# Patient Record
Sex: Female | Born: 1980
Health system: Southern US, Community
[De-identification: ages and names within clinical notes are randomized; demographics above are authoritative.]

## PROBLEM LIST (undated history)

## (undated) DIAGNOSIS — I10 Essential (primary) hypertension: Secondary | ICD-10-CM

## (undated) DIAGNOSIS — Z803 Family history of malignant neoplasm of breast: Secondary | ICD-10-CM

## (undated) DIAGNOSIS — D649 Anemia, unspecified: Secondary | ICD-10-CM

## (undated) DIAGNOSIS — H6120 Impacted cerumen, unspecified ear: Secondary | ICD-10-CM

## (undated) DIAGNOSIS — N926 Irregular menstruation, unspecified: Secondary | ICD-10-CM

## (undated) DIAGNOSIS — E669 Obesity, unspecified: Secondary | ICD-10-CM

## (undated) HISTORY — DX: Obesity, unspecified: E66.9

## (undated) HISTORY — DX: Anemia, unspecified: D64.9

## (undated) HISTORY — DX: Irregular menstruation, unspecified: N92.6

## (undated) HISTORY — PX: OTHER SURGICAL HISTORY: SHX169

## (undated) HISTORY — PX: CHOLECYSTECTOMY: SHX55

## (undated) HISTORY — DX: Family history of malignant neoplasm of breast: Z80.3

## (undated) HISTORY — DX: Essential (primary) hypertension: I10

## (undated) HISTORY — DX: Impacted cerumen, unspecified ear: H61.20

---

## 2004-02-12 ENCOUNTER — Ambulatory Visit: Payer: Self-pay | Admitting: Family Medicine

## 2004-06-02 ENCOUNTER — Ambulatory Visit: Payer: Self-pay | Admitting: Family Medicine

## 2004-06-02 ENCOUNTER — Other Ambulatory Visit: Admission: RE | Admit: 2004-06-02 | Discharge: 2004-06-02 | Payer: Self-pay | Admitting: Family Medicine

## 2004-10-18 ENCOUNTER — Ambulatory Visit: Payer: Self-pay | Admitting: Family Medicine

## 2005-04-01 ENCOUNTER — Ambulatory Visit: Payer: Self-pay | Admitting: Family Medicine

## 2005-10-12 HISTORY — PX: APPENDECTOMY: SHX54

## 2005-10-27 ENCOUNTER — Ambulatory Visit: Payer: Self-pay | Admitting: Surgery

## 2005-11-17 ENCOUNTER — Ambulatory Visit: Payer: Self-pay | Admitting: Family Medicine

## 2005-12-22 ENCOUNTER — Ambulatory Visit: Payer: Self-pay | Admitting: Family Medicine

## 2006-01-26 ENCOUNTER — Ambulatory Visit: Payer: Self-pay | Admitting: Family Medicine

## 2006-05-05 ENCOUNTER — Ambulatory Visit: Payer: Self-pay | Admitting: Family Medicine

## 2006-05-05 LAB — CONVERTED CEMR LAB
Basophils Absolute: 0 10*3/uL (ref 0.0–0.1)
Basophils Relative: 0.4 % (ref 0.0–1.0)
Eosinophils Absolute: 0.1 10*3/uL (ref 0.0–0.6)
Eosinophils Relative: 2.1 % (ref 0.0–5.0)
HCT: 37.1 % (ref 36.0–46.0)
Hemoglobin: 12.6 g/dL (ref 12.0–15.0)
Iron: 44 ug/dL (ref 42–145)
Lymphocytes Relative: 25.1 % (ref 12.0–46.0)
MCHC: 34.1 g/dL (ref 30.0–36.0)
MCV: 81.1 fL (ref 78.0–100.0)
Monocytes Absolute: 0.5 10*3/uL (ref 0.2–0.7)
Monocytes Relative: 10.6 % (ref 3.0–11.0)
Neutro Abs: 2.9 10*3/uL (ref 1.4–7.7)
Neutrophils Relative %: 61.8 % (ref 43.0–77.0)
Platelets: 215 10*3/uL (ref 150–400)
RBC: 4.57 M/uL (ref 3.87–5.11)
RDW: 13.3 % (ref 11.5–14.6)
Saturation Ratios: 13.9 % — ABNORMAL LOW (ref 20.0–50.0)
Transferrin: 226.3 mg/dL (ref >=212.0)
WBC: 4.7 10*3/uL (ref 4.5–10.5)

## 2006-06-08 ENCOUNTER — Ambulatory Visit: Payer: Self-pay | Admitting: Family Medicine

## 2006-07-03 ENCOUNTER — Other Ambulatory Visit: Admission: RE | Admit: 2006-07-03 | Discharge: 2006-07-03 | Payer: Self-pay | Admitting: Family Medicine

## 2006-07-03 ENCOUNTER — Encounter: Payer: Self-pay | Admitting: Family Medicine

## 2006-07-03 ENCOUNTER — Ambulatory Visit: Payer: Self-pay | Admitting: Family Medicine

## 2006-07-03 LAB — CONVERTED CEMR LAB: Pap Smear: NORMAL

## 2006-07-11 ENCOUNTER — Encounter: Payer: Self-pay | Admitting: Family Medicine

## 2006-08-24 ENCOUNTER — Encounter: Payer: Self-pay | Admitting: Family Medicine

## 2006-08-24 DIAGNOSIS — N946 Dysmenorrhea, unspecified: Secondary | ICD-10-CM | POA: Insufficient documentation

## 2006-08-24 DIAGNOSIS — I10 Essential (primary) hypertension: Secondary | ICD-10-CM | POA: Insufficient documentation

## 2006-08-24 DIAGNOSIS — D509 Iron deficiency anemia, unspecified: Secondary | ICD-10-CM | POA: Insufficient documentation

## 2006-08-28 ENCOUNTER — Ambulatory Visit: Payer: Self-pay | Admitting: Family Medicine

## 2006-08-29 LAB — CONVERTED CEMR LAB
ALT: 28 units/L (ref 0–40)
AST: 22 units/L (ref 0–37)
Albumin: 3.7 g/dL (ref 3.5–5.2)
Alkaline Phosphatase: 62 units/L (ref 39–117)
BUN: 11 mg/dL (ref 6–23)
Bilirubin, Direct: 0.1 mg/dL (ref 0.0–0.3)
CO2: 28 meq/L (ref 19–32)
Calcium: 8.5 mg/dL (ref 8.4–10.5)
Chloride: 111 meq/L (ref 96–112)
Cholesterol: 159 mg/dL (ref 0–200)
Creatinine, Ser: 0.8 mg/dL (ref 0.4–1.2)
GFR calc Af Amer: 112 mL/min
GFR calc non Af Amer: 93 mL/min
Glucose, Bld: 92 mg/dL (ref 70–99)
HDL: 39.9 mg/dL (ref >39.0)
LDL Cholesterol: 98 mg/dL (ref 0–99)
Potassium: 4.1 meq/L (ref 3.5–5.1)
Sodium: 142 meq/L (ref 135–145)
TSH: 2.5 microintl units/mL (ref 0.35–5.50)
Total Bilirubin: 0.6 mg/dL (ref 0.3–1.2)
Total CHOL/HDL Ratio: 4
Total Protein: 6.6 g/dL (ref 6.0–8.3)
Triglycerides: 107 mg/dL (ref 0–149)
VLDL: 21 mg/dL (ref 0–40)

## 2006-09-26 ENCOUNTER — Telehealth (INDEPENDENT_AMBULATORY_CARE_PROVIDER_SITE_OTHER): Payer: Self-pay | Admitting: *Deleted

## 2006-10-01 ENCOUNTER — Encounter: Payer: Self-pay | Admitting: Family Medicine

## 2006-10-16 ENCOUNTER — Ambulatory Visit: Payer: Self-pay | Admitting: Family Medicine

## 2006-10-17 LAB — CONVERTED CEMR LAB
Albumin: 4.1 g/dL (ref 3.5–5.2)
BUN: 11 mg/dL (ref 6–23)
CO2: 25 meq/L (ref 19–32)
Calcium: 9.1 mg/dL (ref 8.4–10.5)
Chloride: 106 meq/L (ref 96–112)
Creatinine, Ser: 0.81 mg/dL (ref 0.40–1.20)
Glucose, Bld: 97 mg/dL (ref 70–99)
Phosphorus: 3.3 mg/dL (ref 2.3–4.6)
Potassium: 4 meq/L (ref 3.5–5.3)
Sodium: 141 meq/L (ref 135–145)

## 2007-02-21 ENCOUNTER — Ambulatory Visit: Payer: Self-pay | Admitting: Family Medicine

## 2007-04-19 ENCOUNTER — Ambulatory Visit: Payer: Self-pay | Admitting: Family Medicine

## 2007-04-19 DIAGNOSIS — R55 Syncope and collapse: Secondary | ICD-10-CM | POA: Insufficient documentation

## 2007-08-13 ENCOUNTER — Ambulatory Visit: Payer: Self-pay | Admitting: Family Medicine

## 2007-08-13 DIAGNOSIS — L259 Unspecified contact dermatitis, unspecified cause: Secondary | ICD-10-CM | POA: Insufficient documentation

## 2007-11-20 ENCOUNTER — Other Ambulatory Visit: Admission: RE | Admit: 2007-11-20 | Discharge: 2007-11-20 | Payer: Self-pay | Admitting: Family Medicine

## 2007-11-20 ENCOUNTER — Ambulatory Visit: Payer: Self-pay | Admitting: Family Medicine

## 2007-11-20 ENCOUNTER — Encounter: Payer: Self-pay | Admitting: Family Medicine

## 2007-11-20 DIAGNOSIS — N912 Amenorrhea, unspecified: Secondary | ICD-10-CM | POA: Insufficient documentation

## 2007-11-20 LAB — HM PAP SMEAR

## 2007-11-21 LAB — CONVERTED CEMR LAB
Albumin: 4.3 g/dL (ref 3.5–5.2)
BUN: 12 mg/dL (ref 6–23)
Basophils Absolute: 0 10*3/uL (ref 0.0–0.1)
Basophils Relative: 0 % (ref 0–1)
CO2: 24 meq/L (ref 19–32)
Calcium: 9 mg/dL (ref 8.4–10.5)
Chloride: 107 meq/L (ref 96–112)
Creatinine, Ser: 0.79 mg/dL (ref 0.40–1.20)
Eosinophils Absolute: 0.1 10*3/uL (ref 0.0–0.7)
Eosinophils Relative: 2 % (ref 0–5)
FSH: 5.7 milliintl units/mL
Glucose, Bld: 89 mg/dL (ref 70–99)
HCT: 39.9 % (ref 36.0–46.0)
Hemoglobin: 12.3 g/dL (ref 12.0–15.0)
LH: 9.7 milliintl units/mL
Lymphocytes Relative: 24 % (ref 12–46)
Lymphs Abs: 1.8 10*3/uL (ref 0.7–4.0)
MCHC: 30.8 g/dL (ref 30.0–36.0)
MCV: 84 fL (ref 78.0–100.0)
Monocytes Absolute: 0.4 10*3/uL (ref 0.1–1.0)
Monocytes Relative: 6 % (ref 3–12)
Neutro Abs: 5 10*3/uL (ref 1.7–7.7)
Neutrophils Relative %: 68 % (ref 43–77)
Phosphorus: 3.2 mg/dL (ref 2.3–4.6)
Platelets: 272 10*3/uL (ref 150–400)
Potassium: 4.6 meq/L (ref 3.5–5.3)
Preg, Serum: NEGATIVE
Prolactin: 7.9 ng/mL
RBC: 4.75 M/uL (ref 3.87–5.11)
RDW: 14.4 % (ref 11.5–15.5)
Sodium: 140 meq/L (ref 135–145)
TSH: 2.805 microintl units/mL (ref 0.350–4.50)
WBC: 7.4 10*3/uL (ref 4.0–10.5)

## 2007-12-24 ENCOUNTER — Telehealth: Payer: Self-pay | Admitting: Family Medicine

## 2008-01-21 ENCOUNTER — Encounter: Payer: Self-pay | Admitting: Family Medicine

## 2008-01-22 ENCOUNTER — Encounter: Payer: Self-pay | Admitting: Family Medicine

## 2008-04-23 ENCOUNTER — Ambulatory Visit: Payer: Self-pay | Admitting: Family Medicine

## 2008-04-23 DIAGNOSIS — K589 Irritable bowel syndrome without diarrhea: Secondary | ICD-10-CM | POA: Insufficient documentation

## 2008-06-26 ENCOUNTER — Ambulatory Visit: Payer: Self-pay | Admitting: Family Medicine

## 2008-06-26 DIAGNOSIS — K3189 Other diseases of stomach and duodenum: Secondary | ICD-10-CM | POA: Insufficient documentation

## 2008-06-26 DIAGNOSIS — R1013 Epigastric pain: Secondary | ICD-10-CM

## 2008-06-26 LAB — CONVERTED CEMR LAB
Bilirubin Urine: NEGATIVE
Blood in Urine, dipstick: NEGATIVE
Casts: 0 /lpf
Glucose, Urine, Semiquant: NEGATIVE
Ketones, urine, test strip: NEGATIVE
Nitrite: NEGATIVE
Specific Gravity, Urine: >=1.03
Urine crystals, microscopic: 0 /hpf
Urobilinogen, UA: 0.2
Yeast, UA: 0
pH: 6

## 2008-06-27 ENCOUNTER — Encounter: Payer: Self-pay | Admitting: Family Medicine

## 2009-05-14 ENCOUNTER — Ambulatory Visit: Payer: Self-pay | Admitting: Family Medicine

## 2009-05-14 DIAGNOSIS — H6120 Impacted cerumen, unspecified ear: Secondary | ICD-10-CM | POA: Insufficient documentation

## 2010-03-25 ENCOUNTER — Ambulatory Visit
Admission: RE | Admit: 2010-03-25 | Discharge: 2010-03-25 | Payer: Self-pay | Source: Home / Self Care | Attending: Family Medicine | Admitting: Family Medicine

## 2010-03-29 ENCOUNTER — Telehealth: Payer: Self-pay | Admitting: Family Medicine

## 2010-04-13 NOTE — Letter (Signed)
Summary: [facility]   [facility]   Imported By: Carin Primrose 07/01/2008 14:23:08  _____________________________________________________________________  External Attachment:    Type:   Image     Comment:   External Document  Appended Document: [facility]  please let pt know I just rev her pelvic US from november- there was a possible cyst on the L- and I wonder if this is causing her pain  I would like her to f/u with her gyn for current L sided pelvic pain- please let me know if she needs a referral   Appended Document: [facility]  Advised patient.

## 2010-04-13 NOTE — Assessment & Plan Note (Signed)
Summary: PAIN IN LEFT SIDE/ABOUT 2 TO 3 WEEKS/BIR   Vital Signs:  Patient profile:   30 year old female Height:      66 inches Weight:      277 pounds BMI:     44.87 Temp:     98 degrees F oral Pulse rate:   120 / minute BP sitting:   132 / 84  (left arm) Cuff size:   large  Vitals Entered By: Liane Comber (June 26, 2008 11:51 AM)  History of Present Illness: her side hurts - in pelvic area /lower L side -- a little bit every day  more severe once per week  no back or flank pain at all   had uti in winter - thought that was causing it then  it got some better  now is on and off - only seldomly severe  if she leans to R- it hurts more  also hurts more when her bladder is full   bms are better since starting citrucel daily (used to be constipated )   some indigestion occ nausea but no vomiting  no specific foods flare it   just had menses - fairly regular  menses are heavy and usually last about 7 days  also thinks the pain gets worse around her period  is not sexually active at all - and not pregnant   no burning or odor to urine   did have pelvic US with her gyn in january- for different pain-- and excessive bleeding -- and it was normal       Allergies: No Known Drug Allergies  Past History:  Past Surgical History:    Right foot fracture    Appendectomy (10/2005)     (08/24/2006)  Family History:    Father:     Mother:     Siblings: 1 brother, 1 sister    GM HTN    MGF MI, and HTN    GM rectal cancer      (11/20/2007)  Social History:    Marital Status:     Children:     Occupation:     Never Smoked    occasional alcohol      (04/19/2007)  Past Medical History:    Anemia-iron deficiency    Hypertension- borderline    irregular menses             [facility]   Review of Systems General:  Denies fatigue, fever, loss of appetite, and malaise. Eyes:  Denies blurring. CV:  Denies chest pain or discomfort, lightheadness, and  palpitations. Resp:  Denies cough and wheezing. GI:  Complains of indigestion, nausea, and yellowish skin color; denies abdominal pain, diarrhea, gas, loss of appetite, and vomiting; taking pepcid for indigestion- does help. GU:  Denies discharge, dysuria, hematuria, urinary frequency, and urinary hesitancy. MS:  Complains of low back pain and mid back pain. Derm:  Denies itching, poor wound healing, and rash. Neuro:  Denies numbness, tingling, and weakness. Endo:  Denies excessive thirst and excessive urination.  Physical Exam  General:  obese but well appearing  Head:  normocephalic, atraumatic, and no abnormalities observed.   Eyes:  vision grossly intact, pupils equal, pupils round, and pupils reactive to light.   Mouth:  pharynx pink and moist.   Neck:  No deformities, masses, or tenderness noted. Chest Wall:  No deformities, masses, or tenderness noted. Lungs:  Normal respiratory effort, chest expands symmetrically. Lungs are clear to auscultation, no crackles or wheezes.  Heart:  Normal rate and regular rhythm. S1 and S2 normal without gallop, murmur, click, rub or other extra sounds. Abdomen:  very slt epigastric tenderness suprapubic tenderness- without rebound or gaurding soft, normal bowel sounds, no masses, no guarding, no rigidity, no hepatomegaly, and no splenomegaly.   Msk:  No deformity or scoliosis noted of thoracic or lumbar spine.   Extremities:  No clubbing, cyanosis, edema, or deformity noted with normal full range of motion of all joints.   Neurologic:  sensation intact to light touch and gait normal.   Skin:  Intact without suspicious lesions or rashes no pallor or jaundice  Cervical Nodes:  No lymphadenopathy noted Inguinal Nodes:  No significant adenopathy Psych:  normal affect, talkative and pleasant    Impression & Recommendations:  Problem # 1:  UTI (ICD-599.0) Assessment New with intermittent L lower abd pain and nausea pend urine culture  tx with  cipro - and update  if pain does not resolve- discussed option of pelvic US to look for cyst (per pt had [place] at [facility] this winter - will have to call for that) adv to continue citrucel for IBS  Her updated medication list for this problem includes:    Cipro 250 Mg Tabs (Ciprofloxacin hcl) .Marland Kitchen... 1 by mouth twice daily  for 5 days  Orders: T-Culture, Urine ([id number]) UA Dipstick W/ Micro (manual) (29562)  Problem # 2:  DYSPEPSIA (ICD-536.8) Assessment: New more frequent lately with some wt gain  will try pepcid q am and update if not imp disc diet /anti reflux  Complete Medication List: 1)  Ferrous Sulfate 325 (65 Fe) Mg Tbec (Ferrous sulfate) .... Take one by mouth daily 2)  Hydrochlorothiazide 25 Mg Tabs (Hydrochlorothiazide) .... Take one by mouth daily 3)  Elocon 0.1 % Crea (Mometasone furoate) .... Apply to affected area (small amount ) once daily as directed 4)  Pepcid Ac Maximum Strength 20 Mg Tabs (Famotidine) .Marland Kitchen.. 1 by mouth once daily 5)  Cipro 250 Mg Tabs (Ciprofloxacin hcl) .Marland Kitchen.. 1 by mouth twice daily  for 5 days  Patient Instructions: 1)  start taking one pepcid every am  2)  continue drinking lots of water 3)  call or seek care is symptoms don't improve in 2-3 days or if you develop back pain, nausea, or vomiting  4)  take the cipro as directed  5)  I will update you with a plan when urine culture returns  Prescriptions: CIPRO 250 MG TABS (CIPROFLOXACIN HCL) 1 by mouth twice daily  for 5 days  #10 x 0   Entered and Authorized by:   Judith Part MD   Signed by:   Judith Part MD on 06/26/2008   Method used:   Print then Give to Patient   RxID:   [id number]       Prior Medications (reviewed today): FERROUS SULFATE 325 (65 FE) MG  TBEC (FERROUS SULFATE) take one by mouth daily HYDROCHLOROTHIAZIDE 25 MG TABS (HYDROCHLOROTHIAZIDE) take one by mouth daily ELOCON 0.1 %  CREA (MOMETASONE FUROATE) apply to affected area (small amount ) once daily  as directed PEPCID AC MAXIMUM STRENGTH 20 MG TABS (FAMOTIDINE) 1 by mouth once daily CIPRO 250 MG TABS (CIPROFLOXACIN HCL) 1 by mouth twice daily  for 5 days Current Allergies (reviewed today): No known allergies  Current Medications (including changes made in today's visit):  FERROUS SULFATE 325 (65 FE) MG  TBEC (FERROUS SULFATE) take one by mouth daily HYDROCHLOROTHIAZIDE 25 MG TABS (  HYDROCHLOROTHIAZIDE) take one by mouth daily ELOCON 0.1 %  CREA (MOMETASONE FUROATE) apply to affected area (small amount ) once daily as directed PEPCID AC MAXIMUM STRENGTH 20 MG TABS (FAMOTIDINE) 1 by mouth once daily CIPRO 250 MG TABS (CIPROFLOXACIN HCL) 1 by mouth twice daily  for 5 days  Laboratory Results   Urine Tests  Date/Time Received: June 26, 2008 12:31 PM Date/Time Reported: June 26, 2008 12:31 PM   Routine Urinalysis   Color: yellow Appearance: Clear Glucose: negative   (Normal Range: Negative) Bilirubin: negative   (Normal Range: Negative) Ketone: negative   (Normal Range: Negative) Spec. Gravity: >=1.030   (Normal Range: 1.003-1.035) Blood: negative   (Normal Range: Negative) pH: 6.0   (Normal Range: 5.0-8.0) Protein: trace   (Normal Range: Negative) Urobilinogen: 0.2   (Normal Range: 0-1) Nitrite: negative   (Normal Range: Negative) Leukocyte Esterace: trace   (Normal Range: Negative)  Urine Microscopic WBC/HPF: many RBC/HPF: 2-3 Bacteria/HPF: many Mucous/HPF: mod Epithelial/HPF: 1-4 Crystals/HPF: 0 Casts/LPF: 0 Yeast/HPF: 0

## 2010-04-13 NOTE — Assessment & Plan Note (Signed)
Summary: BACK PAIN/CLE   Vital Signs:  Patient Profile:   30 Years Old Female Weight:      277 pounds Temp:     97.7 degrees F oral Pulse rate:   68 / minute Pulse rhythm:   regular  Vitals Entered By: Lowella Petties (February 21, 2007 11:44 AM)                 Chief Complaint:  Pain across lower back.  History of Present Illness: back has hurt for about a month on and off severe at times- pain in lower back at times limited rom- cannot bend over pain occas radiates to one or both legs no numbness, no weakness , no bowel or bladder incontinence  now is not too bad- just dull pain across lower back has not taken anything for it- rarely ibuprofen which does not help much has not tried heat or ice best when lying down in bed no specific injury or hx of back pain in the past in that area  Current Allergies: No known allergies      Review of Systems  General      Denies chills and fever.  CV      Denies chest pain or discomfort.  Resp      Denies cough.  GU      Denies discharge, dysuria, urinary frequency, and urinary hesitancy.  Derm      Denies lesion(s) and rash.  Neuro      Denies numbness and weakness.   Physical Exam  General:     overweight but generally well appearing  Head:     normocephalic, atraumatic, and no abnormalities observed.   Eyes:     vision grossly intact, pupils equal, pupils round, and pupils reactive to light.   Neck:     supple and full ROM.  no bony tenderness Lungs:     Normal respiratory effort, chest expands symmetrically. Lungs are clear to auscultation, no crackles or wheezes. Heart:     Normal rate and regular rhythm. S1 and S2 normal without gallop, murmur, click, rub or other extra sounds. Msk:     no LS process tenderness mild bilat peri lumbar tenderness/tightness no skin changes flex 45 deg ext 20 deg with discomfort mild pain on lat flex twist is nl neg SLR bilat Extremities:     No clubbing,  cyanosis, edema, or deformity noted with normal full range of motion of all joints.   Neurologic:     strength normal in all extremities, sensation intact to light touch, gait normal, and DTRs symmetrical and normal.   Skin:     Intact without suspicious lesions or rashes Psych:     nl affect    Impression & Recommendations:  Problem # 1:  BACK PAIN (ICD-724.5) low back pain/strain with no neurologic signs or symptoms disc mechanics of low back pain- and tx plan given flexeril to use as needed with caution and aleve will refer to PT if worse or no imp will get LS films Her updated medication list for this problem includes:    Flexeril 10 Mg Tabs (Cyclobenzaprine hcl) .Marland Kitchen... 1/2 to 1 by mouth three times a day as needed back pain  Orders: Physical Therapy Referral (PT)   Complete Medication List: 1)  Ferrous Sulfate 325 (65 Fe) Mg Tbec (Ferrous sulfate) .... Take one by mouth daily 2)  Hctz 25 Mg  .Marland KitchenMarland Kitchen. 1 by mouth q am 3)  Flexeril 10 Mg Tabs (Cyclobenzaprine hcl) .Marland KitchenMarland KitchenMarland Kitchen  1/2 to 1 by mouth three times a day as needed back pain   Patient Instructions: 1)  you can try aleve 1-2 pills twice daily with food as needed for back pain 2)  also use flexeril as needed (muscle relaxer)- caution because it can sedate 3)  use heat on back when needed 4)  we will refer you to PT at check out 5)  if you suddenly get worse, or have any new numbness, weakness or incontinence- please let me know 6)  if not improving in 2-4 weeks- also let me know    Prescriptions: FLEXERIL 10 MG  TABS (CYCLOBENZAPRINE HCL) 1/2 to 1 by mouth three times a day as needed back pain  #30 x 0   Entered and Authorized by:   Judith Part MD   Signed by:   Judith Part MD on 02/21/2007   Method used:   Print then Give to Patient   RxID:   [id number]  ]

## 2010-04-13 NOTE — Consult Note (Signed)
Summary: [facility]/Consultation Report/Dr. [facility]/Consultation Report/Dr. Patton Salles   Imported By: Mickle Asper 01/25/2008 16:10:28  _____________________________________________________________________  External Attachment:    Type:   Image     Comment:   External Document

## 2010-04-13 NOTE — Assessment & Plan Note (Signed)
Summary: cpx w/pap come fasting will do labs that day/rbh   Vital Signs:  Patient Profile:   30 Years Old Female Weight:      284 pounds Temp:     97.9 degrees F oral Pulse rate:   76 / minute Pulse rhythm:   regular BP sitting:   110 / 78  (left arm) Cuff size:   large  Vitals Entered By: Liane Comber (November 20, 2007 10:00 AM)                 Chief Complaint:  cpx/ pap.  History of Present Illness: feeling good  needs pap and gyn exam today  had period the end of june skipped july  spotted all through august no cramping  not on birth control currently -- and not trying to get pregnant -- and no chance   periods have never done this before  no stress or wt changes--  back in june -- gained 10 lbs and then lost 5 no new meds of supplement   eating lots more veg-- cut back on meat and bread and cut back on portions does exercise videos  goal is to start 20 min every other day-- has not started yet  has counted calories with other diet--- less than 2000  takes pepcid AC daily - for indigestion       Current Allergies (reviewed today): No known allergies   Past Medical History:    Reviewed history from 08/24/2006 and no changes required:       Anemia-iron deficiency       Hypertension- borderline  Past Surgical History:    Reviewed history from 08/24/2006 and no changes required:       Right foot fracture       Appendectomy (10/2005)   Family History:    Reviewed history from 04/19/2007 and no changes required:       Father:        Mother:        Siblings: 1 brother, 1 sister       GM HTN       MGF MI, and HTN       GM rectal cancer   Social History:    Reviewed history from 04/19/2007 and no changes required:       Marital Status:        Children:        Occupation:        Never Smoked       occasional alcohol     Review of Systems  General      Denies chills, fatigue, fever, and malaise.  Eyes      Denies blurring and eye  pain.  ENT      Denies sore throat.  CV      Denies chest pain or discomfort, palpitations, and shortness of breath with exertion.  Resp      Denies cough and wheezing.  GI      Complains of indigestion.      Denies abdominal pain, bloody stools, and change in bowel habits.      takes pepcid which helps indigestion  GU      Denies discharge and dysuria.  MS      Denies joint pain, joint redness, and joint swelling.  Derm      Denies itching, lesion(s), and rash.  Neuro      Denies numbness and tingling.  Psych      mood is fairly good  Endo      Denies cold intolerance, excessive thirst, excessive urination, and heat intolerance.  Heme      Denies abnormal bruising.   Physical Exam  General:     overweight but generally well appearing  Head:     normocephalic, atraumatic, and no abnormalities observed.   Eyes:     vision grossly intact, pupils equal, pupils round, and pupils reactive to light.   Ears:     R ear normal and L ear normal.   Nose:     no nasal discharge.   Mouth:     pharynx pink and moist.   Neck:     supple with full rom and no masses or thyromegally, no JVD or carotid bruit  Chest Wall:     No deformities, masses, or tenderness noted. Breasts:     No mass, nodules, thickening, tenderness, bulging, retraction, inflamation, nipple discharge or skin changes noted.   Lungs:     Normal respiratory effort, chest expands symmetrically. Lungs are clear to auscultation, no crackles or wheezes. Heart:     Normal rate and regular rhythm. S1 and S2 normal without gallop, murmur, click, rub or other extra sounds. Abdomen:     Bowel sounds positive,abdomen soft and non-tender without masses, organomegaly or hernias noted.  no renal bruits  Rectal:     No external abnormalities noted. Normal sphincter tone. No rectal masses or tenderness.- heme neg stool  Genitalia:     Normal introitus for age, no external lesions, no vaginal discharge, mucosa  pink and moist, no vaginal or cervical lesions, no vaginal atrophy, no friaility or hemorrhage, normal uterus size and position, no adnexal masses or tenderness Msk:     No deformity or scoliosis noted of thoracic or lumbar spine.   Pulses:     R and L carotid,radial,femoral,dorsalis pedis and posterior tibial pulses are full and equal bilaterally Extremities:     No clubbing, cyanosis, edema, or deformity noted with normal full range of motion of all joints.   Neurologic:     sensation intact to light touch, gait normal, and DTRs symmetrical and normal.   Skin:     Intact without suspicious lesions or rashes some lentigos  Cervical Nodes:     No lymphadenopathy noted Axillary Nodes:     No palpable lymphadenopathy Inguinal Nodes:     No significant adenopathy Psych:     normal affect, talkative and pleasant     Impression & Recommendations:  Problem # 1:  HEALTH MAINTENANCE EXAM (ICD-V70.0) Assessment: Comment Only reviewed health habits including diet, exercise and skin cancer prevention reviewed health maintenance list and family history urged imp of wt loss for general health  Td update today Orders: Venipuncture (54098)   Problem # 2:  ROUTINE GYNECOLOGICAL EXAMINATION (ICD-V72.31) Assessment: Comment Only pap done amenorrhea-- see note  Problem # 3:  AMENORRHEA (ICD-626.0) Assessment: New will do labs incl fsh/lh and pl, serum preg if all neg - and still no menses-- consider prog w/d weight may be a factor-- discussed this  ? consider poss of pcos in future (no hirsuitism-- however) Orders: Venipuncture (11914) T-CBC w/Diff ([id number]) T-FSH ([id number]) T-LH (95284-13244) T-Prolactin [phone]) T-TSH ([phone]) T-Pregnancy (Serum), Qual.  (56387-56433)   Problem # 4:  OBESITY (ICD-278.00) Assessment: Unchanged disc plan to try 1500 cal diet and exercise 5 days per week    Problem # 5:  HYPERTENSION (ICD-401.9) Assessment: Unchanged bp is  very well controlled on hctz check renal panel rev  labs- good chol last year  Her updated medication list for this problem includes:    Hydrochlorothiazide 25 Mg Tabs (Hydrochlorothiazide) .Marland Kitchen... Take one by mouth daily  Orders: Venipuncture (73710) T-Renal Function Panel [id number])  BP today: 110/78 Prior BP: 120/82 (08/13/2007)  Labs Reviewed: Creat: 0.81 (10/16/2006) Chol: 159 (08/28/2006)   HDL: 39.9 (08/28/2006)   LDL: 98 (08/28/2006)   TG: 107 (08/28/2006)   Problem # 6:  ANEMIA-IRON DEFICIENCY (ICD-280.9) Assessment: Improved has been resolved on 1 iron tab daily Her updated medication list for this problem includes:    Ferrous Sulfate 325 (65 Fe) Mg Tbec (Ferrous sulfate) .Marland Kitchen... Take one by mouth daily   Complete Medication List: 1)  Ferrous Sulfate 325 (65 Fe) Mg Tbec (Ferrous sulfate) .... Take one by mouth daily 2)  Hydrochlorothiazide 25 Mg Tabs (Hydrochlorothiazide) .... Take one by mouth daily 3)  Elocon 0.1 % Crea (Mometasone furoate) .... Apply to affected area (small amount ) once daily as directed 4)  Pepcid Ac Maximum Strength 20 Mg Tabs (Famotidine) .Marland Kitchen.. 1 by mouth once daily  Other Orders: Tetanus Toxoid w/Dx (70350) Admin 1st Vaccine (09381)   Patient Instructions: 1)  get back to better diet and exercise for weight loss 2)  It is important that you exercise regularly at least 20 minutes 5 times a week. If you develop chest pain, have severe difficulty breathing, or feel very tired , stop exercising immediately and seek medical attention. 3)  we are doing some labs for missed period today  4)  if no regular period in next 2-3 months -- please  update me    Prescriptions: PEPCID AC MAXIMUM STRENGTH 20 MG TABS (FAMOTIDINE) 1 by mouth once daily  #90 x 3   Entered and Authorized by:   Judith Part MD   Signed by:   Judith Part MD on 11/20/2007   Method used:   Print then Give to Patient   RxID:   [id number] FERROUS SULFATE 325 (65 FE)  MG  TBEC (FERROUS SULFATE) take one by mouth daily  #90 x 3   Entered and Authorized by:   Judith Part MD   Signed by:   Judith Part MD on 11/20/2007   Method used:   Print then Give to Patient   RxID:   [id number] HYDROCHLOROTHIAZIDE 25 MG TABS (HYDROCHLOROTHIAZIDE) take one by mouth daily  #90 x 3   Entered and Authorized by:   Judith Part MD   Signed by:   Judith Part MD on 11/20/2007   Method used:   Print then Give to Patient   RxID:   [id number]  ]   Tetanus/Td Vaccine    Vaccine Type: Td    Site: left deltoid    Mfr: Sanofi Pasteur    Dose: 0.5 ml    Route: IM    Given by: Liane Comber    Exp. Date: 05/27/2009    Lot #: [id number]    VIS given: 01/30/07 version given November 20, 2007.

## 2010-04-13 NOTE — Assessment & Plan Note (Signed)
Summary: 1-2 month follow up/rbh   Vital Signs:  Patient Profile:   30 Years Old Female Weight:      274 pounds Temp:     97.4 degrees F oral Pulse rate:   84 / minute Pulse rhythm:   regular BP sitting:   128 / 82  (left arm) Cuff size:   large  Vitals Entered By: Lowella Petties (October 16, 2006 9:09 AM)               Chief Complaint:  1-2 month follow up.  History of Present Illness: is here for HTN f/u newly on HCTZ doing well overall- exercising regularly has been trying to loose wt, and now on fluid pill hctz has not checked bp  no side eff/dizziness/or probs  Current Allergies: No known allergies      Review of Systems      See HPI  General      Denies fatigue and loss of appetite.  Eyes      Denies blurring.  CV      Denies chest pain or discomfort and shortness of breath with exertion.      is exercising in pool and at home  Resp      Denies cough.  Derm      Denies rash.  Psych      mood is good   Physical Exam  General:     overwt but well app Head:     Normocephalic and atraumatic without obvious abnormalities. No apparent alopecia or balding. Eyes:     vision grossly intact, pupils equal, pupils round, and pupils reactive to light.   Mouth:     pharynx pink and moist.   Neck:     No deformities, masses, or tenderness noted.no thyromegaly and no carotid bruits.   Chest Wall:     No deformities, masses, or tenderness noted. Lungs:     Normal respiratory effort, chest expands symmetrically. Lungs are clear to auscultation, no crackles or wheezes. Heart:     Normal rate and regular rhythm. S1 and S2 normal without gallop, murmur, click, rub or other extra sounds. Pulses:     R and L carotid,radial,femoral,dorsalis pedis and posterior tibial pulses are full and equal bilaterally Extremities:     No clubbing, cyanosis, edema, or deformity noted with normal full range of motion of all joints.   Neurologic:     gait normal and DTRs  symmetrical and normal.   Skin:     turgor normal, color normal, and no rashes.   Cervical Nodes:     No lymphadenopathy noted Psych:     quiet and pleasant    Impression & Recommendations:  Problem # 1:  HYPERTENSION (ICD-401.9) in good control will continue hctz and good diet and exercise follow up in 6 mo Orders: Venipuncture (40981) TLB-Renal Function Panel (80069-[id number])   Complete Medication List: 1)  Ferrous Sulfate 325 (65 Fe) Mg Tbec (Ferrous sulfate) .... Take one by mouth daily 2)  Hctz 25 Mg  .Marland KitchenMarland Kitchen. 1 by mouth q am   Patient Instructions: 1)  continue hctz 2)  keep working on diet and exercise 3)  follow up in 6 months    Prescriptions: HCTZ 25 MG 1 by mouth q am  #30 x 11   Entered and Authorized by:   Judith Part MD   Signed by:   Judith Part MD on 10/16/2006   Method used:   Print then Give to Patient  RxID:   [id number]        Appended Document: Orders Update    Clinical Lists Changes  Orders: Added new Test order of T-Renal Function Panel [id number]) - Signed

## 2010-04-13 NOTE — Assessment & Plan Note (Signed)
Summary: CHECK PLACE ON FINGER/CLE   Vital Signs:  Patient Profile:   30 Years Old Female Weight:      279 pounds Temp:     98.9 degrees F oral Pulse rate:   84 / minute Pulse rhythm:   regular BP sitting:   120 / 82  (left arm) Cuff size:   small  Vitals Entered By: Lowella Petties (August 13, 2007 12:56 PM)                 Chief Complaint:  Check spot right first finger.  History of Present Illness: has blister like lesion on R index finer was like "a bubble"- and then burst and re occured farther down the finger hurts to the touch has to type- which is bothersome  no hx of eeczema or dry skin in the past  uses a moisturizing soap at work uses anti bacterial soap and lotion at home   no injury to finger  is prone to cut herself when cooking - but not recently     Current Allergies: No known allergies   Past Medical History:    Reviewed history from 08/24/2006 and no changes required:       Anemia-iron deficiency       Hypertension- borderline  Past Surgical History:    Reviewed history from 08/24/2006 and no changes required:       Right foot fracture       Appendectomy (10/2005)   Family History:    Reviewed history from 04/19/2007 and no changes required:       Father:        Mother:        Siblings: 1 brother, 1 sister       GM HTN       MGF MI, and HTN  Social History:    Reviewed history from 04/19/2007 and no changes required:       Marital Status:        Children:        Occupation:        Never Smoked       occasional alcohol     Review of Systems  General      Denies chills, fatigue, and fever.  Eyes      Denies discharge, eye pain, and itching.  ENT      Denies nasal congestion and postnasal drainage.  Resp      Denies cough and wheezing.  MS      Denies joint pain, joint redness, and joint swelling.  Derm      Complains of dryness, insect bite(s), and rash.      Denies changes in color of skin and changes in nail  beds.  Psych      .mood is ok   Physical Exam  General:     overweight but generally well appearing  Eyes:     vision grossly intact, pupils equal, pupils round, and pupils reactive to light.   Neck:     supple with full rom and no masses or thyromegally, no JVD or carotid bruit  Lungs:     Normal respiratory effort, chest expands symmetrically. Lungs are clear to auscultation, no crackles or wheezes. Heart:     Normal rate and regular rhythm. S1 and S2 normal without gallop, murmur, click, rub or other extra sounds. Skin:     R index finger 1-2 cm area of scale/ peeling and healing small (pinpiont) vesicle slt redness no excoriation  or breakdown  Cervical Nodes:     No lymphadenopathy noted Psych:     normal affect, talkative and pleasant     Impression & Recommendations:  Problem # 1:  CONTACT DERMATITIS&OTHER ECZEMA DUE UNSPEC CAUSE (ICD-692.9) Assessment: New suspect local reaction to harsh cleanser/antibact lotion adv to switch to cetaphil/ dove soap and unscented moisturizer use elocon cream once daily- 2 weeks- and update if not imp  Her updated medication list for this problem includes:    Elocon 0.1 % Crea (Mometasone furoate) .Marland Kitchen... Apply to affected area (small amount ) once daily as directed   Complete Medication List: 1)  Ferrous Sulfate 325 (65 Fe) Mg Tbec (Ferrous sulfate) .... Take one by mouth daily 2)  Hctz 25 Mg  .Marland KitchenMarland Kitchen. 1 by mouth q am 3)  Elocon 0.1 % Crea (Mometasone furoate) .... Apply to affected area (small amount ) once daily as directed   Patient Instructions: 1)  use the elocon cream once daily on affected area of hand for 2 weeks 2)  if conditon worsens or any redness or swelling- update me  3)  start using cetaphil cleanser or dove soap for sensitive skin both at home and work 4)  for moisturizer use lubriderm or eucerin lotion as needed  5)  call if not improved in 2 weeks    Prescriptions: ELOCON 0.1 %  CREA (MOMETASONE FUROATE)  apply to affected area (small amount ) once daily as directed  #1 small x 0   Entered and Authorized by:   Judith Part MD   Signed by:   Judith Part MD on 08/13/2007   Method used:   Print then Give to Patient   RxID:   [id number]  ] Prior Medications (reviewed today): FERROUS SULFATE 325 (65 FE) MG  TBEC (FERROUS SULFATE) take one by mouth daily HCTZ 25 MG () 1 by mouth q am ELOCON 0.1 %  CREA (MOMETASONE FUROATE) apply to affected area (small amount ) once daily as directed Current Allergies: No known allergies

## 2010-04-13 NOTE — Assessment & Plan Note (Signed)
Summary: BACK PAIN/CLE   Vital Signs:  Patient Profile:   30 Years Old Female Weight:      276 pounds Temp:     98.2 degrees F oral Pulse rate:   96 / minute Pulse rhythm:   regular BP sitting:   130 / 84  (left arm) Cuff size:   large  Vitals Entered By: Providence Crosby (April 23, 2008 3:31 PM)                 Chief Complaint:  BACK PAIN WENT TO URGENT CARE 04/18/2008.  History of Present Illness: I started with lower back/flank pain and was seen at urgent care and was treated for UTI and was treated with Cipro which she is still taking. Back pain sxs have not changed. She went back yest AM because of nausea and vomiting...was told she had IBS and to use a fleets enema. She has lost her appetite but no further nausea or vomiting. No fever, chills, ear pain, rhinitis, nasal congestion, ST, cough. SHe has no abd pain but had some this AM. Yesterday's pain was periumbilical. Her lower back is better, not hurting like when she first presented to the Urgent Care and was treated fotr UTI. Now today she has substernal area discomfort that comes and goes, typically with cough or deep breathing. She relates that she vomited a lot at a time at least four times yesterday. Again, she was mildly nauseated this AM (not anymore) and has not vomited today. She now fels reasonably ok except for this occas "pulling" in her chest, occas with higher low back/flank pain.  Atb the urgent care, she gave the history of diarrhea alternating with constipation reasonablt cyclic but not all the time, sometimes associated with discomfort, sometimes with burping and/or flatus...was told she had IBS and had never put this history together before nor spoken with anyone about it befiore either.    Prior Medications Reviewed Using: Patient Recall  Current Allergies: No known allergies       Physical Exam  General:     Well-developed,well-nourished,in no acute distress; alert,appropriate and cooperative  throughout examination, overweight, comfortable and nontoxic-looking. Head:     Normocephalic and atraumatic without obvious abnormalities. No apparent alopecia or balding. Eyes:     Conjunctiva clear bilaterally.  Ears:     External ear exam shows no significant lesions or deformities.  Otoscopic examination reveals clear canals, tympanic membranes are intact bilaterally without bulging, retraction, inflammation or discharge. Hearing is grossly normal bilaterally. Nose:     External nasal examination shows no deformity or inflammation. Nasal mucosa are pink and moist without lesions or exudates. Mouth:     Oral mucosa and oropharynx without lesions or exudates.  Teeth in good repair. Neck:     No deformities, masses, or tenderness noted. Chest Wall:     No deformities, masses, or tenderness noted. Lungs:     Normal respiratory effort, chest expands symmetrically. Lungs are clear to auscultation, no crackles or wheezes. Heart:     Normal rate and regular rhythm. S1 and S2 normal without gallop, murmur, click, rub or other extra sounds.    Impression & Recommendations:  Problem # 1:  MUSCLE STRAIN, INTERCOSTAL ANTERIORAKLLY (ICD-848.3) Assessment: New Presumed due to vomiting for prolonged period multiple times yesterday. Vomiting seems resolved. Take TYL ES 2 three times a day for the discomfort and give it time.  Problem # 2:  IRRITABLE BOWEL SYNDROME (ICD-564.1) Assessment: New Discussed normalizing sxs by using Citrucel  regularly, 1 tsp in 8 oz water daily.  Complete Medication List: 1)  Ferrous Sulfate 325 (65 Fe) Mg Tbec (Ferrous sulfate) .... Take one by mouth daily 2)  Hydrochlorothiazide 25 Mg Tabs (Hydrochlorothiazide) .... Take one by mouth daily 3)  Elocon 0.1 % Crea (Mometasone furoate) .... Apply to affected area (small amount ) once daily as directed 4)  Pepcid Ac Maximum Strength 20 Mg Tabs (Famotidine) .Marland Kitchen.. 1 by mouth once daily   Patient Instructions: 1)  RTC  if sxs worsen.' 2)  Will take awhile for the chest discomfort to totally reslove. 3)  25 mns spent with pt.

## 2010-04-13 NOTE — Assessment & Plan Note (Signed)
Summary: 1-2 MONTH FOLLOW UP BP/RBH   Vital Signs:  Patient Profile:   30 Years Old Female Weight:      280 pounds Temp:     97.8 degrees F oral Pulse rate:   72 / minute Pulse rhythm:   regular BP sitting:   132 / 98  (right arm) Cuff size:   large  Vitals Entered By: Lowella Petties (August 28, 2006 9:22 AM)               Chief Complaint:  1-2 month follow up.  History of Present Illness: has been doing ok in general here to check BP- which was borderline at last visit denies HA, but does get swollen ankles in hot weather  does drink less caffiene and eats less salt now no regular exercise program needs labs today  does not eat fried foods, avoids red meat, and eats lots of veg a lot of pasta and bread (her weaknesses)  Current Allergies: No known allergies    Family History:    Father:     Mother:     Siblings: 1 brother, 1 sister    GM HTN    Review of Systems      See HPI  General      Denies fatigue.  Eyes      Denies blurring.  CV      Denies chest pain or discomfort and shortness of breath with exertion.  Resp      Denies cough.  MS      some ankle swelling  Derm      Denies rash.  Neuro      Denies numbness.  Psych      stress comes and goes   Serial Vital Signs/Assessments:  Time      Position  BP       Pulse  Resp  Temp     By                     142/85                         Judith Part MD   Physical Exam  General:     obese but well appearing Head:     Normocephalic and atraumatic without obvious abnormalities. No apparent alopecia or balding. Eyes:     vision grossly intact, pupils equal, pupils round, and pupils reactive to light.   Ears:     R ear normal and L ear normal.   Mouth:     MMM Neck:     No deformities, masses, or tenderness noted.no thyromegaly, no JVD, and no carotid bruits.   Chest Wall:     No deformities, masses, or tenderness noted. Lungs:     Normal respiratory effort, chest expands  symmetrically. Lungs are clear to auscultation, no crackles or wheezes. Heart:     Normal rate and regular rhythm. S1 and S2 normal without gallop, murmur, click, rub or other extra sounds. Abdomen:     no renal bruits Msk:     no acute joint changes Pulses:     R and L carotid,radial,femoral,dorsalis pedis and posterior tibial pulses are full and equal bilaterally Extremities:     trace left pedal edema and trace right pedal edema.   Neurologic:     sensation intact to light touch, gait normal, and DTRs symmetrical and normal.   Skin:     turgor normal, color  normal, and no rashes.   Cervical Nodes:     No lymphadenopathy noted Psych:     nl affect, pleasant    Impression & Recommendations:  Problem # 1:  HYPERTENSION (ICD-401.9) BP is mildly elevated pt will continue to work on lifetstyle change and wt loss will start hctz 25 q am f/u 1-2 mo for visit and labs Orders: TLB-Lipid Panel (80061-LIPID) TLB-BMP (Basic Metabolic Panel-BMET) (80048-METABOL) TLB-Hepatic/Liver Function Pnl (80076-HEPATIC) TLB-TSH (Thyroid Stimulating Hormone) (84443-TSH)   Medications Added to Medication List This Visit: 1)  Hctz 25 Mg  .Marland Kitchen.. 1 p qam   Patient Instructions: 1)  take the hctz 1 pill each am 2)  it will help with blood pressure and swelling 3)  keep working on diet and exercise (watch salt) 4)  drink lots of water 5)  follow up in 1-2 months

## 2010-04-13 NOTE — Progress Notes (Signed)
Summary: ?cold  Phone Note Call from Patient Call back at Work Phone [phone]   Caller: Patient Call For: tower Summary of Call: pt  has questions about some symptoms she has had a cold x 3 weeks, it got better last week but then came back she is c/o runny nose,congestion, cough,sneezing, and HA, also has a pain in r side of lower back but not sure if its related. drainage from nose is clear, cough is non prod, dry hacking. She had a st but it went away, denies fever,chills,bodyaches.  please advise? Initial call taken by: Liane Comber,  September 26, 2006 1:53 PM  Follow-up for Phone Call        it sounds like she got another cold if sympt worsen, or she starts to get more sinus pressure/prod cough/ colored phlegm/prod cough please f/u Follow-up by: Judith Part MD,  September 26, 2006 5:10 PM  Additional Follow-up for Phone Call Additional follow up Details #1::        CALLED, NO ANSWER @ work # ..................................................................Marland KitchenLiane Comber  September 26, 2006 5:20 PM   PATIENT ADVISED ..................................................................Marland KitchenMarcelle Smiling Chavers  September 27, 2006 8:50 AM

## 2010-04-13 NOTE — Assessment & Plan Note (Signed)
Summary: FOLLOW UP   Vital Signs:  Patient Profile:   30 Years Old Female Weight:      280 pounds Temp:     97.4 degrees F oral Pulse rate:   80 / minute Pulse rhythm:   regular BP sitting:   120 / 84  (left arm) Cuff size:   large  Vitals Entered By: Lowella Petties (April 19, 2007 8:32 AM)                 Chief Complaint:  Follow up.  History of Present Illness: feels better- back no longer hurts  here for bp check good blood pressure today no problems with hctz, no swelling  watches salt intake- and does not add salt quit eating beef and eats pork now and then- and lots of veg is using yoga ball and PT exercises- including cardio training exercise 30 minutes every other day     Current Allergies: No known allergies    Family History:    Father:     Mother:     Siblings: 1 brother, 1 sister    GM HTN    MGF MI, and HTN  Social History:    Marital Status:     Children:     Occupation:     Never Smoked    occasional alcohol    Risk Factors:  Tobacco use:  never   Review of Systems      See HPI  General      Denies malaise and weight loss.  Eyes      Denies blurring.  CV      Denies chest pain or discomfort and palpitations.  Resp      Denies cough and shortness of breath.  Derm      Denies changes in color of skin and rash.  Neuro      Denies numbness.  Psych      mood is fairly good  Endo      Denies excessive hunger and excessive thirst.   Physical Exam  General:     overweight but generally well appearing  Head:     normocephalic and atraumatic.   Eyes:     vision grossly intact, pupils equal, pupils round, and pupils reactive to light.   Neck:     supple with full rom and no masses or thyromegally, no JVD or carotid bruit  Lungs:     Normal respiratory effort, chest expands symmetrically. Lungs are clear to auscultation, no crackles or wheezes. Heart:     Normal rate and regular rhythm. S1 and S2 normal  without gallop, murmur, click, rub or other extra sounds. Pulses:     R and L carotid,radial,femoral,dorsalis pedis and posterior tibial pulses are full and equal bilaterally Extremities:     No clubbing, cyanosis, edema, or deformity noted with normal full range of motion of all joints.   Neurologic:     sensation intact to light touch, gait normal, and DTRs symmetrical and normal.   Skin:     Intact without suspicious lesions or rashes Cervical Nodes:     No lymphadenopathy noted Psych:     nl affect, cheerful    Impression & Recommendations:  Problem # 1:  HYPERTENSION (ICD-401.9) doing well,  with good bp control on hctz will continue to work on lifestyle change and wt loss f/u 6 mo for PE and labs BP today: 120/84 Prior BP: 128/82 (10/16/2006)  Labs Reviewed: Creat: 0.81 (10/16/2006) Chol: 159 (  08/28/2006)   HDL: 39.9 (08/28/2006)   LDL: 98 (08/28/2006)   TG: 107 (08/28/2006)   Problem # 2:  BACK PAIN (ICD-724.5) resolved with PT- urged to continue her exercises Her updated medication list for this problem includes:    Flexeril 10 Mg Tabs (Cyclobenzaprine hcl) .Marland Kitchen... 1/2 to 1 by mouth three times a day as needed back pain   Problem # 3:  OBESITY (ICD-278.00) pt is aware her wt is a threat to her overall health- esp with HTN will continue to work on diet and exercise for wt loss f/u 6 mo  Complete Medication List: 1)  Ferrous Sulfate 325 (65 Fe) Mg Tbec (Ferrous sulfate) .... Take one by mouth daily 2)  Hctz 25 Mg  .Marland KitchenMarland Kitchen. 1 by mouth q am 3)  Flexeril 10 Mg Tabs (Cyclobenzaprine hcl) .... 1/2 to 1 by mouth three times a day as needed back pain   Patient Instructions: 1)  keep up the good work with diet and exercise 2)  keep working on wt loss 3)  f/u in 6 mo for physical and pap (come fasting- will do labs that day)    Prescriptions: HCTZ 25 MG 1 by mouth q am  #30 x 11   Entered and Authorized by:   Judith Part MD   Signed by:   Judith Part MD on  04/19/2007   Method used:   Print then Give to Patient   RxID:   [id number]  ]

## 2010-04-13 NOTE — Progress Notes (Signed)
Summary: irreg menses  Phone Note Call from Patient Call back at Work Phone [phone]   Caller: Patient Call For: dr tower Summary of Call: Pt states her periods have been irregular, the one she is having now has lasted for 17 days, not really heavy,  and she is passing a lot of clots.  Please advise on what she shoud do.   States she feels shaky Initial call taken by: Lowella Petties,  December 24, 2007 10:34 AM  Follow-up for Phone Call        I want to refer her to gyn for further evaluation-- I will do ref and route to [facility] Follow-up by: Judith Part MD,  December 24, 2007 1:28 PM  Additional Follow-up for Phone Call Additional follow up Details #1::        Advised patient.  ......................................................Marland KitchenLiane Comber December 24, 2007 2:18 PM she wants to see a dr in [place] or [place]  December 24, 2007 2:18 PM     Additional Follow-up for Phone Call Additional follow up Details #2::    DR [facility] ON 01/16/08 AT 8:35. Carlton Adam  December 24, 2007 3:04 PM  Follow-up by: Carlton Adam,  December 24, 2007 3:04 PM

## 2010-04-13 NOTE — Assessment & Plan Note (Signed)
Summary: EARS CLEANED OUT/DLO   Vital Signs:  Patient profile:   30 year old female Height:      66 inches Weight:      237.50 pounds BMI:     38.47 Temp:     98.7 degrees F oral Pulse rate:   88 / minute Pulse rhythm:   regular BP sitting:   126 / 80  (left arm) Cuff size:   large  Vitals Entered By: Lewanda Rife LPN (May 15, 2954 8:56 AM)  History of Present Illness: has lost wt with diet and exercise -- is proud of that   noticed dec hearing since friday  no congestion or cold symptoms just R one  no ringing tends to get ear wax and has had to have them flushed   does use debrox at home as directed about twice a week  Allergies (verified): No Known Drug Allergies  Past History:  Past Surgical History: Last updated: 08/24/2006 Right foot fracture Appendectomy (10/2005)  Family History: Last updated: 11/20/2007 Father:  Mother:  Siblings: 1 brother, 1 sister GM HTN MGF MI, and HTN GM rectal cancer   Social History: Last updated: 04/19/2007 Marital Status:  Children:  Occupation:  Never Smoked occasional alcohol   Risk Factors: Smoking Status: never (04/19/2007)  Past Medical History: Anemia-iron deficiency Hypertension- borderline irregular menses  obesity- wt loss with diet and exercise recurrent cerumen impaction   [place]   Review of Systems General:  Denies chills and fever. Eyes:  Denies blurring, discharge, and eye irritation. ENT:  Complains of decreased hearing; denies ear discharge, earache, hoarseness, nasal congestion, and sore throat. CV:  Denies palpitations. Derm:  Denies itching and rash. Neuro:  Denies sensation of room spinning.  Physical Exam  General:  obese but well appearing  wt loss noted  Head:  normocephalic, atraumatic, and no abnormalities observed.  no sinus or temporal tenderness  Eyes:  vision grossly intact, pupils equal, pupils round, pupils reactive to light, and no injection.   Ears:  TMs -  obscured by cerumen bilat  worse on R  looks wet and soft Nose:  no nasal discharge.   Mouth:  pharynx pink and moist.   Neck:  No deformities, masses, or tenderness noted. Heart:  Normal rate and regular rhythm. S1 and S2 normal without gallop, murmur, click, rub or other extra sounds. Skin:  Intact without suspicious lesions or rashes Cervical Nodes:  No lymphadenopathy noted Psych:  normal affect, talkative and pleasant    Impression & Recommendations:  Problem # 1:  CERUMEN IMPACTION, BILATERAL (ICD-380.4) Assessment New resolved after simple irrigation  no problems  adv to call if ear pain  keep using debrox and update  Complete Medication List: 1)  Ferrous Sulfate 325 (65 Fe) Mg Tbec (Ferrous sulfate) .... Take one by mouth daily 2)  Hydrochlorothiazide 25 Mg Tabs (Hydrochlorothiazide) .... Take one by mouth daily 3)  Pepcid Ac Maximum Strength 20 Mg Tabs (Famotidine) .Marland Kitchen.. 1 by mouth once daily as needed  Patient Instructions: 1)  ears are clear 2)  update me if pain or other symptoms  3)  continue home debrox use   Current Allergies (reviewed today): No known allergies

## 2010-04-13 NOTE — Consult Note (Signed)
Summary: Consultation Report  Consultation Report   Imported By: Eleonore Chiquito 10/03/2006 17:16:17  _____________________________________________________________________  External Attachment:    Type:   Image     Comment:   External Document

## 2010-04-15 NOTE — Assessment & Plan Note (Signed)
Summary: sinus infection/alc   Vital Signs:  Patient profile:   30 year old female Height:      66 inches Weight:      258 pounds BMI:     41.79 Temp:     98.9 degrees F oral Pulse rate:   92 / minute Pulse rhythm:   regular BP sitting:   150 / 90  (left arm) Cuff size:   large  Vitals Entered By: Selena Batten Dance CMA Duncan Dull) (March 25, 2010 10:58 AM) CC: ? Sinus infection   History of Present Illness: CC: ? sinus infection  4d h/o cold sxs, now seems to be moving into chest.  Started with fatigue, sinus pressure, cough started yesterday.  Not productive of any sputum but rattly.  + HA, sinus pressure.  Taking tylenol and pseudophed (bp today 150/90).  now more bothered by sinus congestion  No fevers/chills, abd pain, n/v/d, myalgias, arthralgias.  No ST, ear pain, tooth pain.  + father sick last week.  No smokers at home, no h/o asthma/ allergies.  Current Medications (verified): 1)  Pepcid Ac Maximum Strength 20 Mg Tabs (Famotidine) .Marland Kitchen.. 1 By Mouth Once Daily As Needed  Allergies (verified): No Known Drug Allergies  Past History:  Past Medical History: Last updated: 05/14/2009 Anemia-iron deficiency Hypertension- borderline irregular menses  obesity- wt loss with diet and exercise recurrent cerumen impaction   [facility] [facility]   Social History: Last updated: 04/19/2007 Marital Status:  Children:  Occupation:  Never Smoked occasional alcohol   Review of Systems       per HPI  Physical Exam  General:  obese but well appearing  wt loss noted  Head:  normocephalic, atraumatic, and no abnormalities observed.  no sinus or temporal tenderness  Eyes:  vision grossly intact, pupils equal, pupils round, pupils reactive to light, and no injection.   Ears:  TMs - very high canals, cerumen bilaterally looks wet and soft Nose:  no nasal discharge.   Mouth:  pharynx pink and moist.  no exudates Neck:  No deformities, masses, or tenderness noted.  no LAD Lungs:   Normal respiratory effort, chest expands symmetrically. Lungs are clear to auscultation, no crackles or wheezes. Heart:  normal s1, s2, mild flow sem. Pulses:  2+ rad pulses brisk cap refill bilateraly   Impression & Recommendations:  Problem # 1:  VIRAL URI (ICD-465.9) supportive care.  red flags to return discussed.  Her updated medication list for this problem includes:    Cheratussin Ac 100-10 Mg/49ml Syrp (Guaifenesin-codeine) ..... One teaspoon nightly  Complete Medication List: 1)  Pepcid Ac Maximum Strength 20 Mg Tabs (Famotidine) .Marland Kitchen.. 1 by mouth once daily as needed 2)  Cheratussin Ac 100-10 Mg/45ml Syrp (Guaifenesin-codeine) .... One teaspoon nightly  Patient Instructions: 1)  Sounds like you have a viral upper respiratory infection. 2)  Antibiotics are not needed for this.  Viral infections usually take 7-10 days to resolve.  The cough can last 4 weeks to go away. 3)  Use medication as prescribed: cheratussin for cough at night. 4)  Guaifenesin IR 400mg  1 pill in morning and again at noon with plenty of water.  (or simplex mucinex). 5)  Please return if you are not improving as expected, or if you have high fevers (>101.5) or difficulty swallowing/breathing or worsening productive cough. 6)  Call clinic with questions.  Pleasure to see you today!  Prescriptions: CHERATUSSIN AC 100-10 MG/5ML SYRP (GUAIFENESIN-CODEINE) one teaspoon nightly  #100cc x 0   Entered  and Authorized by:   Eustaquio Boyden  MD   Signed by:   Eustaquio Boyden  MD on 03/25/2010   Method used:   Print then Give to Patient   RxID:   [id number]    Orders Added: 1)  Est. Patient Level III [14782]    Current Allergies (reviewed today): No known allergies

## 2010-04-15 NOTE — Progress Notes (Signed)
Summary: medication refills  Phone Note Refill Request Message from:  [employer] on March 29, 2010 11:45 AM  Refills Requested: Medication #1:  hctz 25mg    Supply Requested: 1 month  Medication #2:  ferrous sulfate 325   Supply Requested: 1 month Not on patients medication list  [facility]   Method Requested: Electronic Initial call taken by: Benny Lennert CMA Duncan Dull),  March 29, 2010 11:46 AM  Follow-up for Phone Call        px written on EMR for call in  Follow-up by: Judith Part MD,  March 29, 2010 1:13 PM  Additional Follow-up for Phone Call Additional follow up Details #1::        Rx called to pharmacy Additional Follow-up by: Sydell Axon LPN,  March 29, 2010 2:12 PM    New/Updated Medications: FERROUS SULFATE 325 (65 FE) MG TABS (FERROUS SULFATE) 1 by mouth once daily HYDROCHLOROTHIAZIDE 25 MG TABS (HYDROCHLOROTHIAZIDE) 1 by mouth once daily Prescriptions: HYDROCHLOROTHIAZIDE 25 MG TABS (HYDROCHLOROTHIAZIDE) 1 by mouth once daily  #30 x 5   Entered by:   Sydell Axon LPN   Authorized by:   Judith Part MD   Signed by:   Sydell Axon LPN on 11/91/4782   Method used:   Telephoned to ...       [facility] [phone]* (retail)       [address] [place], [place]  [address]       Ph: [phone] or [phone]       Fax: [phone]   RxID:   [id number] FERROUS SULFATE 325 (65 FE) MG TABS (FERROUS SULFATE) 1 by mouth once daily  #30 x 5   Entered by:   Sydell Axon LPN   Authorized by:   Judith Part MD   Signed by:   Sydell Axon LPN on 33/29/5188   Method used:   Telephoned to ...       [facility] [phone]* (retail)       [address] [place], [place]  [address]       Ph: [phone] or [phone]       Fax: [phone]   RxID:   [id number]

## 2010-10-05 ENCOUNTER — Encounter: Payer: Self-pay | Admitting: Family Medicine

## 2010-10-06 ENCOUNTER — Encounter: Payer: Self-pay | Admitting: Family Medicine

## 2010-10-06 ENCOUNTER — Ambulatory Visit (INDEPENDENT_AMBULATORY_CARE_PROVIDER_SITE_OTHER): Payer: BC Managed Care – PPO | Admitting: Family Medicine

## 2010-10-06 VITALS — BP 128/76 | HR 80 | Temp 99.1°F | Ht 66.0 in | Wt 198.2 lb

## 2010-10-06 DIAGNOSIS — R109 Unspecified abdominal pain: Secondary | ICD-10-CM

## 2010-10-06 NOTE — Assessment & Plan Note (Signed)
With R shoulder blade area pain and flank pain  Not positional - and did occur after fatty food this weekend Suspect possible gallstones - will check [place]  If neg consider poss kidney stones (pt did not give ua today in light of menses )  If worse adv to call or seek care Will make plan after [place]

## 2010-10-06 NOTE — Patient Instructions (Signed)
We will schedule abdominal ultrasound at check out  Stay away from fatty foods  If worse or severe pain -- go to ER Keep me updated We will call you with result  If negative - we may want to check urine after your period is over

## 2010-10-06 NOTE — Progress Notes (Signed)
Subjective:    Patient ID: Tracey Cunningham, female    DOB: 08-15-1980, 30 y.o.   MRN: [id number]  HPI Here for back pain on and off for 2 years In past year more frequently and hurts worse when it occurs In center of back between shoulder blades/ under R shoulder blade and middle of her chest  Past weekend woke her up at 6 and hurt until 12 Sat hurt from 4 to bedtime  Pain is achey  No nausea  Is not hungry in general  Eating - ? If it makes a difference  Movement does not affect it  No position is good or bad  No urinary symptoms or fever   This weekend ate ribs -- does not normally   Still has gallbladder    Wt is down 60 lb today Using [facility] program and diet with whole foods and veg and lots of protein (also herbal supplements and some green tea and chromium)   Also exercising -- zumba at home  Is proud of this    Patient Active Problem List  Diagnoses  . OBESITY  . ANEMIA-IRON DEFICIENCY  . CERUMEN IMPACTION, BILATERAL  . HYPERTENSION  . DYSPEPSIA  . IRRITABLE BOWEL SYNDROME  . MENORRHALGIA  . AMENORRHEA  . CONTACT DERMATITIS&OTHER ECZEMA DUE UNSPEC CAUSE  . SYNCOPE  . Right sided abdominal pain   Past Medical History  Diagnosis Date  . Anemia     iron deficiency   . Hypertension     borderline  . Irregular periods     [facility] [facility]   . Obesity     weight loss with diet and exercise   . Cerumen impaction    Past Surgical History  Procedure Date  . Foot fracture     right foot   . Appendectomy 10/2005   History  Substance Use Topics  . Smoking status: Never Smoker   . Smokeless tobacco: Not on file  . Alcohol Use: Not on file   Family History  Problem Relation Age of Onset  . Hypertension Maternal Grandfather   . Heart attack Maternal Grandfather    No Known Allergies Current Outpatient Prescriptions on File Prior to Visit  Medication Sig Dispense Refill  . famotidine (PEPCID) 20 MG tablet Take 20 mg by mouth daily.        .  ferrous sulfate 325 (65 FE) MG tablet Take 325 mg by mouth daily.        . hydrochlorothiazide 25 MG tablet Take 25 mg by mouth daily.            Review of Systems Review of Systems  Constitutional: Negative for fever, appetite change, fatigue and unexpected weight change.  Eyes: Negative for pain and visual disturbance.  Respiratory: Negative for cough and shortness of breath.   Cardiovascular: Negative.  For sob or edema or palpitations Gastrointestinal: Negative for nausea, diarrhea and constipation. pos for abd pain/tenderness nad flank pain  Genitourinary: Negative for urgency and frequency. - is having menses now  Skin: Negative for pallor. or rash  Neurological: Negative for weakness, light-headedness, numbness and headaches.  Hematological: Negative for adenopathy. Does not bruise/bleed easily.  Psychiatric/Behavioral: Negative for dysphoric mood. The patient is not nervous/anxious.          Objective:   Physical Exam  Constitutional: She appears well-developed and well-nourished. No distress.       overwt and well appearing Wt loss noted   HENT:  Head: Normocephalic and  atraumatic.  Nose: Nose normal.  Mouth/Throat: Oropharynx is clear and moist.  Eyes: Conjunctivae and EOM are normal. Pupils are equal, round, and reactive to light. No scleral icterus.  Neck: Normal range of motion. Neck supple. No JVD present.  Cardiovascular: Normal rate, regular rhythm and normal heart sounds.   Pulmonary/Chest: Effort normal and breath sounds normal. No respiratory distress. She has no wheezes.  Abdominal: Soft. Bowel sounds are normal. She exhibits no distension and no mass. There is tenderness (RUQ ). There is no rebound and no guarding.       RUQ tenderness esp laterally  Neg murphy sign  No skin changes   Musculoskeletal: She exhibits no edema and no tenderness.       No TS or LS tenderness No cva tenderness   Lymphadenopathy:    She has no cervical adenopathy.    Neurological: She is alert. She has normal reflexes.  Skin: Skin is warm and dry. No rash noted. No erythema. No pallor.  Psychiatric: She has a normal mood and affect.          Assessment & Plan:

## 2010-10-07 ENCOUNTER — Ambulatory Visit: Payer: Self-pay | Admitting: Family Medicine

## 2010-10-08 ENCOUNTER — Encounter: Payer: Self-pay | Admitting: Family Medicine

## 2010-10-08 ENCOUNTER — Telehealth: Payer: Self-pay

## 2010-10-08 DIAGNOSIS — K8020 Calculus of gallbladder without cholecystitis without obstruction: Secondary | ICD-10-CM | POA: Insufficient documentation

## 2010-10-08 DIAGNOSIS — R109 Unspecified abdominal pain: Secondary | ICD-10-CM

## 2010-10-08 NOTE — Telephone Encounter (Signed)
Surgeon Appt made with [facility] on 10/14/2010 at 3pm  Pt notified. MK

## 2010-10-08 NOTE — Telephone Encounter (Signed)
Patient notified as instructed by telephone that [place] of abdomen was + for gallstones and inflammation in gallbladder.Pt is agreeable for surgical consult and would like to see surgeon in [place] who is in the network for her insurance. Pt will wait to hear from pt care coordinator and can be reached at work # [phone] or cell [phone]. The [place] report is on your shelf in in box for final comment. Thank you.

## 2010-10-28 ENCOUNTER — Ambulatory Visit: Payer: Self-pay | Admitting: Anesthesiology

## 2010-10-28 DIAGNOSIS — Z01810 Encounter for preprocedural cardiovascular examination: Secondary | ICD-10-CM

## 2010-11-02 ENCOUNTER — Ambulatory Visit: Payer: Self-pay | Admitting: Surgery

## 2010-11-03 LAB — PATHOLOGY REPORT

## 2010-12-31 ENCOUNTER — Other Ambulatory Visit: Payer: Self-pay | Admitting: *Deleted

## 2010-12-31 MED ORDER — HYDROCHLOROTHIAZIDE 25 MG PO TABS: 25.0000 mg | ORAL_TABLET | Freq: Every day | ORAL | Status: DC

## 2011-02-01 ENCOUNTER — Other Ambulatory Visit: Payer: Self-pay

## 2011-02-01 MED ORDER — FERROUS SULFATE 325 (65 FE) MG PO TABS: 325.0000 mg | ORAL_TABLET | Freq: Every day | ORAL | Status: DC

## 2011-02-01 NOTE — Telephone Encounter (Signed)
[facility]  Faxed refill request Ferrous sulfate 325 mg #30 x 1 with note pt needs to call for appt.

## 2011-05-09 ENCOUNTER — Ambulatory Visit (INDEPENDENT_AMBULATORY_CARE_PROVIDER_SITE_OTHER): Payer: BC Managed Care – PPO | Admitting: Family Medicine

## 2011-05-09 ENCOUNTER — Encounter: Payer: Self-pay | Admitting: Family Medicine

## 2011-05-09 VITALS — BP 120/74 | HR 60 | Temp 98.1°F | Ht 66.0 in | Wt 191.8 lb

## 2011-05-09 DIAGNOSIS — H6120 Impacted cerumen, unspecified ear: Secondary | ICD-10-CM

## 2011-05-09 NOTE — Patient Instructions (Signed)
Ears look good after flush They will feel wet If pain or other symptoms let me know Keep using debrox

## 2011-05-09 NOTE — Progress Notes (Signed)
Subjective:    Patient ID: Tracey Cunningham, female    DOB: [birth year], 31 y.o.   MRN: [id number]  HPI Hearing is muffled on and off 2 weeks  Thinks ears are full of wax Has been using debrox A little pain last week  Got a little wax out  No congestion  No fever   Patient Active Problem List  Diagnoses  . OBESITY  . ANEMIA-IRON DEFICIENCY  . CERUMEN IMPACTION, BILATERAL  . HYPERTENSION  . DYSPEPSIA  . IRRITABLE BOWEL SYNDROME  . MENORRHALGIA  . AMENORRHEA  . CONTACT DERMATITIS&OTHER ECZEMA DUE UNSPEC CAUSE  . SYNCOPE  . Right sided abdominal pain  . Cholelithiasis   Past Medical History  Diagnosis Date  . Anemia     iron deficiency   . Hypertension     borderline  . Irregular periods     [facility] [facility]   . Obesity     weight loss with diet and exercise   . Cerumen impaction    Past Surgical History  Procedure Date  . Foot fracture     right foot   . Appendectomy 10/2005   History  Substance Use Topics  . Smoking status: Never Smoker   . Smokeless tobacco: Not on file  . Alcohol Use: Not on file   Family History  Problem Relation Age of Onset  . Hypertension Maternal Grandfather   . Heart attack Maternal Grandfather    No Known Allergies Current Outpatient Prescriptions on File Prior to Visit  Medication Sig Dispense Refill  . b complex vitamins capsule Take 1 capsule by mouth. 3 times weekly (also contains Vitamin C)       . Calcium-Magnesium-Vitamin D (CALCIUM MAGNESIUM PO) Take 2 capsules by mouth daily.        . ferrous sulfate 325 (65 FE) MG tablet Take 1 tablet (325 mg total) by mouth daily.  30 tablet  1  . hydrochlorothiazide (HYDRODIURIL) 25 MG tablet Take 1 tablet (25 mg total) by mouth daily.  30 tablet  6  . magnesium gluconate (MAGONATE) 500 MG tablet Take 500 mg by mouth daily.        . Multiple Vitamin (MULTIVITAMIN) tablet Take 4 tablets by mouth daily.        . Omega-3 Fatty Acids (FISH OIL PO) Take 2 capsules by mouth daily.          . Cyanocobalamin (VITAMIN B-12 IJ) Inject as directed. Gets Vit B12 injections [facility] weekly at [facility]       . famotidine (PEPCID) 20 MG tablet Take 20 mg by mouth daily.            Review of Systems Review of Systems  Constitutional: Negative for fever, appetite change, fatigue and unexpected weight change.  Eyes: Negative for pain and visual disturbance. ENT pos for decreased/ muffled hearing/ neg for ST or ear pain   Respiratory: Negative for cough and shortness of breath.   Cardiovascular: Negative for cp or palpitations    Gastrointestinal: Negative for nausea, diarrhea and constipation.  Genitourinary: Negative for urgency and frequency.  Skin: Negative for pallor or rash   Neurological: Negative for weakness, light-headedness, numbness and headaches.  Hematological: Negative for adenopathy. Does not bruise/bleed easily.  Psychiatric/Behavioral: Negative for dysphoric mood. The patient is not nervous/anxious.          Objective:   Physical Exam  Constitutional: She appears well-developed and well-nourished. No distress.  HENT:  Head: Normocephalic and atraumatic.  Nose: Nose normal.  Mouth/Throat: Oropharynx is clear and moist.       TMs impacted with cerumen bilat - worse on the L  This is totally cleared after simple ear irrigation  Canals slt erythematous  Tms clear - and improved hearing   Eyes: Conjunctivae and EOM are normal. Pupils are equal, round, and reactive to light. Right eye exhibits no discharge. Left eye exhibits no discharge.  Neck: Normal range of motion. Neck supple. No thyromegaly present.  Cardiovascular: Normal rate and regular rhythm.   Pulmonary/Chest: Effort normal and breath sounds normal.  Lymphadenopathy:    She has no cervical adenopathy.  Skin: Skin is warm and dry. No rash noted. No erythema. No pallor.  Psychiatric: She has a normal mood and affect.          Assessment & Plan:

## 2011-05-09 NOTE — Assessment & Plan Note (Signed)
Worse on L than R Cleared after simple irrigation-then nl exam Adv to keep using debrox on a schedule F/u when needed or if any new symptoms

## 2011-07-16 ENCOUNTER — Other Ambulatory Visit: Payer: Self-pay | Admitting: Family Medicine

## 2011-07-18 NOTE — Telephone Encounter (Signed)
Is patient on iron long term?  Please advise.

## 2011-07-18 NOTE — Telephone Encounter (Signed)
Yes Will refill electronically

## 2011-10-20 ENCOUNTER — Other Ambulatory Visit: Payer: Self-pay | Admitting: *Deleted

## 2011-10-20 MED ORDER — HYDROCHLOROTHIAZIDE 25 MG PO TABS: 25.0000 mg | ORAL_TABLET | Freq: Every day | ORAL | Status: DC

## 2011-12-09 ENCOUNTER — Other Ambulatory Visit: Payer: Self-pay | Admitting: Family Medicine

## 2011-12-12 ENCOUNTER — Telehealth: Payer: Self-pay | Admitting: Family Medicine

## 2011-12-12 MED ORDER — HYDROCHLOROTHIAZIDE 25 MG PO TABS: 25.0000 mg | ORAL_TABLET | Freq: Every day | ORAL | Status: DC

## 2011-12-12 NOTE — Telephone Encounter (Signed)
The patient called the triage line hoping to get her hydrochlorothiazide refilled.  She has an appt for a cpe in November.    Call back - [phone]  Thanks!

## 2011-12-12 NOTE — Telephone Encounter (Signed)
Notified pt Rx was sent to pharm. 

## 2012-01-31 ENCOUNTER — Other Ambulatory Visit (HOSPITAL_COMMUNITY)
Admission: RE | Admit: 2012-01-31 | Discharge: 2012-01-31 | Disposition: A | Discharge status: Home / Self Care | Payer: BC Managed Care – PPO | Source: Ambulatory Visit | Attending: Family Medicine | Admitting: Family Medicine

## 2012-01-31 ENCOUNTER — Ambulatory Visit (INDEPENDENT_AMBULATORY_CARE_PROVIDER_SITE_OTHER): Payer: BC Managed Care – PPO | Admitting: Family Medicine

## 2012-01-31 ENCOUNTER — Encounter: Payer: Self-pay | Admitting: Family Medicine

## 2012-01-31 VITALS — BP 124/76 | HR 65 | Temp 97.9°F | Ht 65.75 in | Wt 228.0 lb

## 2012-01-31 DIAGNOSIS — Z01419 Encounter for gynecological examination (general) (routine) without abnormal findings: Secondary | ICD-10-CM | POA: Insufficient documentation

## 2012-01-31 DIAGNOSIS — I10 Essential (primary) hypertension: Secondary | ICD-10-CM

## 2012-01-31 DIAGNOSIS — Z1151 Encounter for screening for human papillomavirus (HPV): Secondary | ICD-10-CM | POA: Insufficient documentation

## 2012-01-31 DIAGNOSIS — Z23 Encounter for immunization: Secondary | ICD-10-CM

## 2012-01-31 DIAGNOSIS — Z0000 Encounter for general adult medical examination without abnormal findings: Secondary | ICD-10-CM | POA: Insufficient documentation

## 2012-01-31 DIAGNOSIS — N946 Dysmenorrhea, unspecified: Secondary | ICD-10-CM

## 2012-01-31 DIAGNOSIS — E669 Obesity, unspecified: Secondary | ICD-10-CM

## 2012-01-31 LAB — LIPID PANEL
Cholesterol: 149 mg/dL (ref 0–200)
HDL: 54.7 mg/dL (ref >39.00)
LDL Cholesterol: 81 mg/dL (ref 0–99)
Total CHOL/HDL Ratio: 3
Triglycerides: 67 mg/dL (ref 0.0–149.0)
VLDL: 13.4 mg/dL (ref 0.0–40.0)

## 2012-01-31 LAB — COMPREHENSIVE METABOLIC PANEL
ALT: 10 U/L (ref 0–35)
AST: 14 U/L (ref 0–37)
Albumin: 3.8 g/dL (ref 3.5–5.2)
Alkaline Phosphatase: 50 U/L (ref 39–117)
BUN: 10 mg/dL (ref 6–23)
CO2: 26 mEq/L (ref 19–32)
Calcium: 8.8 mg/dL (ref 8.4–10.5)
Chloride: 103 mEq/L (ref 96–112)
Creatinine, Ser: 0.8 mg/dL (ref 0.4–1.2)
GFR: 92.73 mL/min (ref >60.00)
Glucose, Bld: 91 mg/dL (ref 70–99)
Potassium: 3.7 mEq/L (ref 3.5–5.1)
Sodium: 138 mEq/L (ref 135–145)
Total Bilirubin: 0.4 mg/dL (ref 0.3–1.2)
Total Protein: 6.9 g/dL (ref 6.0–8.3)

## 2012-01-31 LAB — CBC WITH DIFFERENTIAL/PLATELET
Basophils Absolute: 0 10*3/uL (ref 0.0–0.1)
Basophils Relative: 0.7 % (ref 0.0–3.0)
Eosinophils Absolute: 0.1 10*3/uL (ref 0.0–0.7)
Eosinophils Relative: 2.5 % (ref 0.0–5.0)
HCT: 38.4 % (ref 36.0–46.0)
Hemoglobin: 12.7 g/dL (ref 12.0–15.0)
Lymphocytes Relative: 25.8 % (ref 12.0–46.0)
Lymphs Abs: 1.4 10*3/uL (ref 0.7–4.0)
MCHC: 33.1 g/dL (ref 30.0–36.0)
MCV: 86.3 fl (ref 78.0–100.0)
Monocytes Absolute: 0.3 10*3/uL (ref 0.1–1.0)
Monocytes Relative: 6.3 % (ref 3.0–12.0)
Neutro Abs: 3.5 10*3/uL (ref 1.4–7.7)
Neutrophils Relative %: 64.7 % (ref 43.0–77.0)
Platelets: 222 10*3/uL (ref 150.0–400.0)
RBC: 4.45 Mil/uL (ref 3.87–5.11)
RDW: 13.2 % (ref 11.5–14.6)
WBC: 5.5 10*3/uL (ref 4.5–10.5)

## 2012-01-31 LAB — TSH: TSH: 1.72 u[IU]/mL (ref 0.35–5.50)

## 2012-01-31 MED ORDER — LEVONORGESTREL-ETHINYL ESTRAD 0.1-20 MG-MCG PO TABS: 1.0000 | ORAL_TABLET | Freq: Every day | ORAL | Status: DC

## 2012-01-31 MED ORDER — HYDROCHLOROTHIAZIDE 25 MG PO TABS: 25.0000 mg | ORAL_TABLET | Freq: Every day | ORAL | Status: DC

## 2012-01-31 NOTE — Assessment & Plan Note (Signed)
bp in fair control at this time  No changes needed  Disc lifstyle change with low sodium diet and exercise  Lab today If bp goes up after starting OC - then will stop that

## 2012-01-31 NOTE — Assessment & Plan Note (Signed)
Annual exam / pap  Start on OC for heavy menses

## 2012-01-31 NOTE — Patient Instructions (Addendum)
Flu shot today  For heavy periods - start the oral contraceptive the first Sunday after your period starts (if it starts on Sunday start it that day) - take one pill daily at around the same time If you are sexually active- also use condoms to prevent STDs Do not smoke Follow up about 8 weeks after starting the pill so we can check your bp  Labs today

## 2012-01-31 NOTE — Assessment & Plan Note (Signed)
Will start lessina OC - disc in detail how to take- see AVS F/u about 10 weeks - to check bp

## 2012-01-31 NOTE — Assessment & Plan Note (Signed)
Reviewed health habits including diet and exercise and skin cancer prevention Also reviewed health mt list, fam hx and immunizations   Lab for wellness today Flu shot today

## 2012-01-31 NOTE — Progress Notes (Signed)
Subjective:    Patient ID: Tracey Cunningham, female    DOB: [birth year], 31 y.o.   MRN: [id number]  HPI Here for health maintenance exam and to review chronic medical problems    Needs gyn exam  Used to go to OBGYN Last pap was 2 years ago  No abn paps  Periods are pretty regular- every 4-5 weeks - is really heavy  bp is good now  Avoided OC in the past due to high bp   Now bp is ok on hctz   Needs labs   Has not had a flu shot yet - will get one today   bp is stable today  No cp or palpitations or headaches or edema  No side effects to medicines  BP Readings from Last 3 Encounters:  01/31/12 124/76  05/09/11 120/74  10/06/10 128/76     Mood - ok - no depression or tearfulness  No problems with that at all   Is taking care of herself - exercise  Doing the [employer]  Is also eating healthy   Wt went back up -- up 37 lb with bmi of 37- is back on track now  Also using my fitness pal   Does self breast exams-no lumps or changes  gmother had breast cancer   Patient Active Problem List  Diagnosis  . OBESITY  . ANEMIA-IRON DEFICIENCY  . CERUMEN IMPACTION, BILATERAL  . HYPERTENSION  . DYSPEPSIA  . IRRITABLE BOWEL SYNDROME  . MENORRHALGIA  . AMENORRHEA  . CONTACT DERMATITIS&OTHER ECZEMA DUE UNSPEC CAUSE  . SYNCOPE  . Right sided abdominal pain  . Cholelithiasis  . Routine gynecological examination   Past Medical History  Diagnosis Date  . Anemia     iron deficiency   . Hypertension     borderline  . Irregular periods     [facility]   . Obesity     weight loss with diet and exercise   . Cerumen impaction    Past Surgical History  Procedure Date  . Foot fracture     right foot   . Appendectomy 10/2005   History  Substance Use Topics  . Smoking status: Never Smoker   . Smokeless tobacco: Not on file  . Alcohol Use: No   Family History  Problem Relation Age of Onset  . Hypertension Maternal Grandfather   . Heart  attack Maternal Grandfather    No Known Allergies Current Outpatient Prescriptions on File Prior to Visit  Medication Sig Dispense Refill  . famotidine (PEPCID) 20 MG tablet Take 20 mg by mouth as needed.       . ferrous sulfate 325 (65 FE) MG tablet TAKE 1 TABLET EVERY DAY  30 tablet  5  . hydrochlorothiazide (HYDRODIURIL) 25 MG tablet Take 1 tablet (25 mg total) by mouth daily.  30 tablet  1    Review of Systems Review of Systems  Constitutional: Negative for fever, appetite change, fatigue and unexpected weight change.  Eyes: Negative for pain and visual disturbance.  Respiratory: Negative for cough and shortness of breath.   Cardiovascular: Negative for cp or palpitations    Gastrointestinal: Negative for nausea, diarrhea and constipation.  Genitourinary: Negative for urgency and frequency. pos for heavy menses with cramps Skin: Negative for pallor or rash   Neurological: Negative for weakness, light-headedness, numbness and headaches.  Hematological: Negative for adenopathy. Does not bruise/bleed easily.  Psychiatric/Behavioral: Negative for dysphoric mood. The patient is not nervous/anxious.  Objective:   Physical Exam  Constitutional: She appears well-developed and well-nourished. No distress.       obese and well appearing   HENT:  Head: Normocephalic and atraumatic.  Right Ear: External ear normal.  Left Ear: External ear normal.  Nose: Nose normal.  Mouth/Throat: Oropharynx is clear and moist.  Eyes: Conjunctivae normal and EOM are normal. Pupils are equal, round, and reactive to light. Right eye exhibits no discharge. Left eye exhibits no discharge. No scleral icterus.  Neck: Normal range of motion. Neck supple. No JVD present. Carotid bruit is not present. No thyromegaly present.  Cardiovascular: Normal rate, regular rhythm, normal heart sounds and intact distal pulses.  Exam reveals no gallop.   Pulmonary/Chest: Effort normal and breath sounds normal. No  respiratory distress. She has no wheezes.  Abdominal: Soft. Bowel sounds are normal. She exhibits no distension, no abdominal bruit and no mass. There is no tenderness.  Genitourinary: Vagina normal and uterus normal. No breast swelling, tenderness, discharge or bleeding. There is no rash, tenderness or lesion on the right labia. There is no rash, tenderness or lesion on the left labia. Uterus is not enlarged and not tender. Cervix exhibits no motion tenderness, no discharge and no friability. Right adnexum displays no mass, no tenderness and no fullness. Left adnexum displays no mass, no tenderness and no fullness. No bleeding around the vagina. No signs of injury around the vagina. No vaginal discharge found.       Breast exam: No mass, nodules, thickening, tenderness, bulging, retraction, inflamation, nipple discharge or skin changes noted.  No axillary or clavicular LA.  Chaperoned exam.    Musculoskeletal: Normal range of motion. She exhibits no edema and no tenderness.  Lymphadenopathy:    She has no cervical adenopathy.  Neurological: She is alert. She has normal reflexes. No cranial nerve deficit. She exhibits normal muscle tone. Coordination normal.  Skin: Skin is warm and dry. No rash noted. No erythema. No pallor.  Psychiatric: She has a normal mood and affect.          Assessment & Plan:

## 2012-01-31 NOTE — Assessment & Plan Note (Signed)
Discussed how this problem influences overall health and the risks it imposes  Reviewed plan for weight loss with lower calorie diet (via better food choices and also portion control or program like weight watchers) and exercise building up to or more than 30 minutes 5 days per week including some aerobic activity   Doing well - getting back on track now

## 2012-02-06 ENCOUNTER — Encounter: Payer: Self-pay | Admitting: *Deleted

## 2012-04-10 ENCOUNTER — Ambulatory Visit: Payer: BC Managed Care – PPO | Admitting: Family Medicine

## 2012-04-12 ENCOUNTER — Other Ambulatory Visit: Payer: Self-pay | Admitting: Family Medicine

## 2012-04-17 ENCOUNTER — Ambulatory Visit (INDEPENDENT_AMBULATORY_CARE_PROVIDER_SITE_OTHER): Payer: BC Managed Care – PPO | Admitting: Family Medicine

## 2012-04-17 ENCOUNTER — Encounter: Payer: Self-pay | Admitting: Family Medicine

## 2012-04-17 VITALS — BP 128/78 | HR 92 | Temp 98.3°F | Ht 65.75 in | Wt 243.2 lb

## 2012-04-17 DIAGNOSIS — I10 Essential (primary) hypertension: Secondary | ICD-10-CM

## 2012-04-17 DIAGNOSIS — N946 Dysmenorrhea, unspecified: Secondary | ICD-10-CM

## 2012-04-17 NOTE — Patient Instructions (Addendum)
I'm glad the pill is working out  If any problems let me know  bp is good - keep an eye on it

## 2012-04-17 NOTE — Progress Notes (Signed)
Subjective:    Patient ID: Tracey Cunningham, female    DOB: [birth year], 32 y.o.   MRN: [id number]  HPI Last visit started lessina OC  Really helping menses  Last 4-5 days  Not nearly as heavy- but still has cramping (takes ibuprofen for the first few days)  No side effects or problems   bp is 128/78 bp is stable today  No cp or palpitations or headaches or edema  No side effects to medicines  BP Readings from Last 3 Encounters:  04/17/12 128/78  01/31/12 124/76  05/09/11 120/74     Patient Active Problem List  Diagnosis  . OBESITY  . ANEMIA-IRON DEFICIENCY  . CERUMEN IMPACTION, BILATERAL  . HYPERTENSION  . DYSPEPSIA  . IRRITABLE BOWEL SYNDROME  . MENORRHALGIA  . AMENORRHEA  . CONTACT DERMATITIS&OTHER ECZEMA DUE UNSPEC CAUSE  . SYNCOPE  . Right sided abdominal pain  . Cholelithiasis  . Routine gynecological examination  . Routine general medical examination at a health care facility   Past Medical History  Diagnosis Date  . Anemia     iron deficiency   . Hypertension     borderline  . Irregular periods     OBGYN- [facility] side   . Obesity     weight loss with diet and exercise   . Cerumen impaction    Past Surgical History  Procedure Date  . Foot fracture     right foot   . Appendectomy 10/2005   History  Substance Use Topics  . Smoking status: Never Smoker   . Smokeless tobacco: Not on file  . Alcohol Use: No   Family History  Problem Relation Age of Onset  . Hypertension Maternal Grandfather   . Heart attack Maternal Grandfather    No Known Allergies Current Outpatient Prescriptions on File Prior to Visit  Medication Sig Dispense Refill  . famotidine (PEPCID) 20 MG tablet Take 20 mg by mouth as needed.       . ferrous sulfate 325 (65 FE) MG tablet TAKE 1 TABLET EVERY DAY  30 tablet  0  . hydrochlorothiazide (HYDRODIURIL) 25 MG tablet Take 1 tablet (25 mg total) by mouth daily.  30 tablet  11  . levonorgestrel-ethinyl estradiol  (AVIANE,ALESSE,LESSINA) 0.1-20 MG-MCG tablet Take 1 tablet by mouth daily.  1 Package  11      Review of Systems Review of Systems  Constitutional: Negative for fever, appetite change, fatigue and unexpected weight change.  Eyes: Negative for pain and visual disturbance.  Respiratory: Negative for cough and shortness of breath.   Cardiovascular: Negative for cp or palpitations    Gastrointestinal: Negative for nausea, diarrhea and constipation.  Genitourinary: Negative for urgency and frequency.  Skin: Negative for pallor or rash   Neurological: Negative for weakness, light-headedness, numbness and headaches.  Hematological: Negative for adenopathy. Does not bruise/bleed easily.  Psychiatric/Behavioral: Negative for dysphoric mood. The patient is not nervous/anxious.         Objective:   Physical Exam  Constitutional: She appears well-developed and well-nourished. No distress.       obese and well appearing   HENT:  Head: Normocephalic and atraumatic.  Mouth/Throat: Oropharynx is clear and moist.  Eyes: Conjunctivae normal and EOM are normal. Pupils are equal, round, and reactive to light. Right eye exhibits no discharge. Left eye exhibits no discharge.  Neck: Normal range of motion. Neck supple. No JVD present. Carotid bruit is not present. No thyromegaly present.  Cardiovascular: Normal rate, regular rhythm, normal  heart sounds and intact distal pulses.  Exam reveals no gallop.   Pulmonary/Chest: Effort normal and breath sounds normal. No respiratory distress. She has no wheezes.  Abdominal: Soft. Bowel sounds are normal. She exhibits no abdominal bruit.  Musculoskeletal: She exhibits no edema.  Lymphadenopathy:    She has no cervical adenopathy.  Neurological: She is alert. She has normal reflexes. No cranial nerve deficit. She exhibits normal muscle tone. Coordination normal.  Skin: Skin is warm and dry. No rash noted. No erythema. No pallor.  Psychiatric: She has a normal  mood and affect.          Assessment & Plan:

## 2012-04-17 NOTE — Assessment & Plan Note (Signed)
bp is stable on OC and did not rise- this is reassuring bp in fair control at this time  No changes needed  Disc lifstyle change with low sodium diet and exercise   Enc wt loss

## 2012-04-17 NOTE — Assessment & Plan Note (Signed)
Much imp after 10 weeks on OC - will continue this as no side eff and bp is ok  This is reassuring Pt has 1 year of refils

## 2012-07-06 ENCOUNTER — Other Ambulatory Visit: Payer: Self-pay | Admitting: Family Medicine

## 2012-08-24 ENCOUNTER — Ambulatory Visit (INDEPENDENT_AMBULATORY_CARE_PROVIDER_SITE_OTHER): Payer: BC Managed Care – PPO | Admitting: Family Medicine

## 2012-08-24 ENCOUNTER — Encounter: Payer: Self-pay | Admitting: Family Medicine

## 2012-08-24 VITALS — BP 108/78 | HR 73 | Temp 98.1°F | Ht 65.75 in | Wt 240.5 lb

## 2012-08-24 DIAGNOSIS — H6122 Impacted cerumen, left ear: Secondary | ICD-10-CM

## 2012-08-24 DIAGNOSIS — H6120 Impacted cerumen, unspecified ear: Secondary | ICD-10-CM

## 2012-08-24 NOTE — Assessment & Plan Note (Signed)
This time- just the L side Total resolution after simple ear irrigation - tolerated well  Will continue to use debrox at home on a schedule as this is recurrent

## 2012-08-24 NOTE — Progress Notes (Signed)
Subjective:    Patient ID: Tracey Cunningham, female    DOB: March 27, 1980, 32 y.o.   MRN: [id number]  HPI Here to get ears flushed  Using debrox over the counter at home- does not get much out  Hearing is off- esp in L ear   No pain No drainage No sinus symptoms  Patient Active Problem List   Diagnosis Date Noted  . Routine gynecological examination 01/31/2012  . Routine general medical examination at a health care facility 01/31/2012  . Cholelithiasis 10/08/2010  . Right sided abdominal pain 10/06/2010  . CERUMEN IMPACTION, BILATERAL 05/14/2009  . DYSPEPSIA 06/26/2008  . IRRITABLE BOWEL SYNDROME 04/23/2008  . AMENORRHEA 11/20/2007  . CONTACT DERMATITIS&OTHER ECZEMA DUE UNSPEC CAUSE 08/13/2007  . OBESITY 04/19/2007  . SYNCOPE 04/19/2007  . ANEMIA-IRON DEFICIENCY 08/24/2006  . HYPERTENSION 08/24/2006  . MENORRHALGIA 08/24/2006   Past Medical History  Diagnosis Date  . Anemia     iron deficiency   . Hypertension     borderline  . Irregular periods     OBGYN- [facility] side   . Obesity     weight loss with diet and exercise   . Cerumen impaction    Past Surgical History  Procedure Laterality Date  . Foot fracture      right foot   . Appendectomy  10/2005   History  Substance Use Topics  . Smoking status: Never Smoker   . Smokeless tobacco: Not on file  . Alcohol Use: Yes     Comment: wine- occ   Family History  Problem Relation Age of Onset  . Hypertension Maternal Grandfather   . Heart attack Maternal Grandfather    No Known Allergies Current Outpatient Prescriptions on File Prior to Visit  Medication Sig Dispense Refill  . famotidine (PEPCID) 20 MG tablet Take 20 mg by mouth as needed.       . ferrous sulfate 325 (65 FE) MG tablet TAKE 1 TABLET EVERY DAY  30 tablet  5  . hydrochlorothiazide (HYDRODIURIL) 25 MG tablet Take 1 tablet (25 mg total) by mouth daily.  30 tablet  11  . levonorgestrel-ethinyl estradiol (AVIANE,ALESSE,LESSINA) 0.1-20 MG-MCG tablet Take  1 tablet by mouth daily.  1 Package  11   No current facility-administered medications on file prior to visit.     Review of Systems Review of Systems  Constitutional: Negative for fever, appetite change, fatigue and unexpected weight change.  Eyes: Negative for pain and visual disturbance.  ENT pos for L ear fullness and dec hearing/ neg for discharge or pain or sinus problems  Respiratory: Negative for cough and shortness of breath.   Cardiovascular: Negative for cp or palpitations    Gastrointestinal: Negative for nausea, diarrhea and constipation.  Genitourinary: Negative for urgency and frequency.  Skin: Negative for pallor or rash   Neurological: Negative for weakness, light-headedness, numbness and headaches.  Hematological: Negative for adenopathy. Does not bruise/bleed easily.  Psychiatric/Behavioral: Negative for dysphoric mood. The patient is not nervous/anxious.         Objective:   Physical Exam  Constitutional: She appears well-developed and well-nourished. No distress.  obese and well appearing   HENT:  Head: Normocephalic and atraumatic.  Right Ear: External ear normal.  Mouth/Throat: Oropharynx is clear and moist.  L TM - cerumen impaction total  After irrigation-clear with nl TM and symmetric hearing    Eyes: Conjunctivae and EOM are normal. Pupils are equal, round, and reactive to light.  Neurological: She is alert. No  cranial nerve deficit.  Skin: Skin is warm and dry. No rash noted. No erythema.  Psychiatric: She has a normal mood and affect.          Assessment & Plan:

## 2012-08-24 NOTE — Patient Instructions (Addendum)
Ears are clear now  If any problems let us know  Keep using debrox in both ears on a schedule

## 2012-11-16 ENCOUNTER — Ambulatory Visit (INDEPENDENT_AMBULATORY_CARE_PROVIDER_SITE_OTHER): Payer: BC Managed Care – PPO | Admitting: Family Medicine

## 2012-11-16 ENCOUNTER — Encounter: Payer: Self-pay | Admitting: Family Medicine

## 2012-11-16 VITALS — BP 122/82 | HR 74 | Temp 98.8°F | Ht 65.75 in | Wt 250.2 lb

## 2012-11-16 DIAGNOSIS — L255 Unspecified contact dermatitis due to plants, except food: Secondary | ICD-10-CM

## 2012-11-16 DIAGNOSIS — L237 Allergic contact dermatitis due to plants, except food: Secondary | ICD-10-CM | POA: Insufficient documentation

## 2012-11-16 MED ORDER — PREDNISONE 10 MG PO TABS: ORAL_TABLET | ORAL | Status: DC

## 2012-11-16 NOTE — Progress Notes (Signed)
Subjective:    Patient ID: Tracey Cunningham, female    DOB: Sep 02, 1980, 32 y.o.   MRN: [id number]  HPI Here with poison ivy  Rash started about 10 days ago (wed) and thinks she was exp the wknd before - working in her yard  Very itchy Started in L arm antecubital area  Then broke out on ext and abd  Not on feet or face   Using calamine lotion Benadryl was too sedating   Patient Active Problem List   Diagnosis Date Noted  . Routine gynecological examination 01/31/2012  . Routine general medical examination at a health care facility 01/31/2012  . Cholelithiasis 10/08/2010  . Right sided abdominal pain 10/06/2010  . CERUMEN IMPACTION, BILATERAL 05/14/2009  . DYSPEPSIA 06/26/2008  . IRRITABLE BOWEL SYNDROME 04/23/2008  . AMENORRHEA 11/20/2007  . CONTACT DERMATITIS&OTHER ECZEMA DUE UNSPEC CAUSE 08/13/2007  . OBESITY 04/19/2007  . SYNCOPE 04/19/2007  . ANEMIA-IRON DEFICIENCY 08/24/2006  . HYPERTENSION 08/24/2006  . MENORRHALGIA 08/24/2006   Past Medical History  Diagnosis Date  . Anemia     iron deficiency   . Hypertension     borderline  . Irregular periods     OBGYN- [place] side   . Obesity     weight loss with diet and exercise   . Cerumen impaction    Past Surgical History  Procedure Laterality Date  . Foot fracture      right foot   . Appendectomy  10/2005   History  Substance Use Topics  . Smoking status: Never Smoker   . Smokeless tobacco: Not on file  . Alcohol Use: Yes     Comment: wine- occ   Family History  Problem Relation Age of Onset  . Hypertension Maternal Grandfather   . Heart attack Maternal Grandfather    No Known Allergies Current Outpatient Prescriptions on File Prior to Visit  Medication Sig Dispense Refill  . famotidine (PEPCID) 20 MG tablet Take 20 mg by mouth as needed.       . ferrous sulfate 325 (65 FE) MG tablet TAKE 1 TABLET EVERY DAY  30 tablet  5  . hydrochlorothiazide (HYDRODIURIL) 25 MG tablet Take 1 tablet (25 mg total) by  mouth daily.  30 tablet  11  . levonorgestrel-ethinyl estradiol (AVIANE,ALESSE,LESSINA) 0.1-20 MG-MCG tablet Take 1 tablet by mouth daily.  1 Package  11   No current facility-administered medications on file prior to visit.      Review of Systems Review of Systems  Constitutional: Negative for fever, appetite change, fatigue and unexpected weight change.  Eyes: Negative for pain and visual disturbance.  Respiratory: Negative for cough and shortness of breath. Neg for wheeze   Cardiovascular: Negative for cp or palpitations    Gastrointestinal: Negative for nausea, diarrhea and constipation.  Genitourinary: Negative for urgency and frequency.  Skin: Negative for pallor and pos for rash / itch  Neurological: Negative for weakness, light-headedness, numbness and headaches.  Hematological: Negative for adenopathy. Does not bruise/bleed easily.  Psychiatric/Behavioral: Negative for dysphoric mood. The patient is not nervous/anxious.         Objective:   Physical Exam  Constitutional: She appears well-developed and well-nourished. No distress.  obese and well appearing   HENT:  Head: Normocephalic and atraumatic.  Mouth/Throat: Oropharynx is clear and moist.  Eyes: Conjunctivae and EOM are normal. Pupils are equal, round, and reactive to light. Right eye exhibits no discharge. Left eye exhibits no discharge.  Neck: Normal range of motion. Neck supple.  Cardiovascular: Normal rate and regular rhythm.   Lymphadenopathy:    She has no cervical adenopathy.  Skin: Skin is warm and dry. There is erythema. There is pallor.  Erythematous rash - vesicular in linear pattern --in patches on ext and abdomen consistent with plant dermatitis  Psychiatric: She has a normal mood and affect.          Assessment & Plan:

## 2012-11-16 NOTE — Patient Instructions (Addendum)
Make sure you wash anything that came in contact with poison ivy the day you were working outside Zyrtec 10 mg daily may help with itch Prednisone-take as directed  Try cort aid on the rash itself  Keep cool   Update if not starting to improve in a week or if worsening

## 2012-11-18 NOTE — Assessment & Plan Note (Signed)
Initial encounter Diffuse and very symptomatic tx with low dose pred taper Disc poss side eff in detail  Disc symptomatic care - see instructions on AVS Update if not starting to improve in a week or if worsening

## 2012-11-20 ENCOUNTER — Telehealth: Payer: Self-pay | Admitting: Family Medicine

## 2012-11-20 NOTE — Telephone Encounter (Signed)
Pt left v/m that if cb after 5 pm to call [phone].

## 2012-11-20 NOTE — Telephone Encounter (Signed)
Continue the prednisone  Is there any way you may still be coming into contact with the poison ivy oil (clothes that were worn/ shoes/ a pet that may have been in it/ garden gloves etc? Where are the new spots starting?

## 2012-11-20 NOTE — Telephone Encounter (Signed)
Patient Information:  Caller Name: Randi  Phone: [phone]  Patient: Tracey Cunningham, Tracey Cunningham  Gender: Female  DOB: [birth year]  Age: 32 Years  PCP: Roxy Manns [facility])  Pregnant: No  Office Follow Up:  Does the office need to follow up with this patient?: Yes  Instructions For The Office: Contact patient regarding her questions.  RN Note:  Patient wanted to know if she should continue taking the current medications or if she needs to take something else. Please contact patient regarding this request and her current symptoms.  Symptoms  Reason For Call & Symptoms: Was seen on 11/16/12 for poison ivy. Was given medication which she is half way finished with and she is still having new blisters appear.  Reviewed Health History In EMR: Yes  Reviewed Medications In EMR: Yes  Reviewed Allergies In EMR: Yes  Reviewed Surgeries / Procedures: Yes  Date of Onset of Symptoms: 11/07/2012  Treatments Tried: Prednisone, Zyrtec, Hydrocortisone  Treatments Tried Worked: Yes OB / GYN:  LMP: 11/14/2012  Guideline(s) Used:  Poison Ivy - [place] or [place]  Disposition Per Guideline:   See Today in Office  Reason For Disposition Reached:   Taking oral steroids more than 24 hours and rash becoming worse  Advice Given:  N/A  Patient Will Follow Care Advice:  YES

## 2012-11-21 MED ORDER — PREDNISONE 10 MG PO TABS: ORAL_TABLET | ORAL | Status: DC

## 2012-11-21 NOTE — Telephone Encounter (Signed)
Pt left v/m requesting cb; still getting new break outs of poison ivy.

## 2012-11-21 NOTE — Telephone Encounter (Signed)
Let's repeat the prednisone taper from the beginning again and see if we make more progress  I will send to her pharmacy ([facility] [address]) If no further improvement - then follow up so we can take another look at it  If pus or other signs of bacterial infection- like fever- let me know earlier

## 2012-11-21 NOTE — Telephone Encounter (Signed)
Pt said she washed and cleaned everything, her clothes, shoes, bedding and furniture she sits on so she doesn't think she is still coming in contact with the poison ivy oil. She said the poison ivy is still on her arm it's just spreading up her arm, please advise

## 2012-11-22 NOTE — Telephone Encounter (Signed)
Pt notified of Dr. Tower's comments/ recommendations  

## 2013-01-07 ENCOUNTER — Other Ambulatory Visit: Payer: Self-pay | Admitting: Family Medicine

## 2013-01-07 NOTE — Telephone Encounter (Signed)
Please schedule PE and refill until then, thanks

## 2013-01-07 NOTE — Telephone Encounter (Signed)
Electronic refill request, no recent/future appt (f/u or CPE), please advise

## 2013-01-09 NOTE — Telephone Encounter (Signed)
CPE scheduled and med refilled  

## 2013-01-17 ENCOUNTER — Other Ambulatory Visit: Payer: Self-pay

## 2013-02-01 ENCOUNTER — Other Ambulatory Visit: Payer: Self-pay | Admitting: Family Medicine

## 2013-02-01 NOTE — Telephone Encounter (Signed)
Electronic refill request, this BC isn't on pt's current med list, please advise

## 2013-02-01 NOTE — Telephone Encounter (Signed)
Please call her and clarify that, thanks

## 2013-02-03 ENCOUNTER — Other Ambulatory Visit: Payer: Self-pay | Admitting: Family Medicine

## 2013-02-05 NOTE — Telephone Encounter (Signed)
Please refill for a month 

## 2013-02-05 NOTE — Telephone Encounter (Signed)
Pt called for status of Tracey Cunningham refill to [facility], pt will be out of med 02/09/13 and pt has CPX scheduled 02/13/13. Spoke with [facility] and Isidore Moos is generic for Aviane.Please advise. Pt does not need call back if refilled.

## 2013-02-13 ENCOUNTER — Ambulatory Visit (INDEPENDENT_AMBULATORY_CARE_PROVIDER_SITE_OTHER): Payer: BC Managed Care – PPO | Admitting: Family Medicine

## 2013-02-13 ENCOUNTER — Other Ambulatory Visit (HOSPITAL_COMMUNITY)
Admission: RE | Admit: 2013-02-13 | Discharge: 2013-02-13 | Disposition: A | Discharge status: Home / Self Care | Payer: BC Managed Care – PPO | Source: Ambulatory Visit | Attending: Family Medicine | Admitting: Family Medicine

## 2013-02-13 ENCOUNTER — Encounter: Payer: Self-pay | Admitting: Family Medicine

## 2013-02-13 VITALS — BP 128/70 | HR 81 | Temp 98.3°F | Ht 65.25 in | Wt 263.0 lb

## 2013-02-13 DIAGNOSIS — Z0000 Encounter for general adult medical examination without abnormal findings: Secondary | ICD-10-CM

## 2013-02-13 DIAGNOSIS — I10 Essential (primary) hypertension: Secondary | ICD-10-CM

## 2013-02-13 DIAGNOSIS — Z01419 Encounter for gynecological examination (general) (routine) without abnormal findings: Secondary | ICD-10-CM

## 2013-02-13 DIAGNOSIS — Z23 Encounter for immunization: Secondary | ICD-10-CM

## 2013-02-13 DIAGNOSIS — E669 Obesity, unspecified: Secondary | ICD-10-CM

## 2013-02-13 MED ORDER — HYDROCHLOROTHIAZIDE 25 MG PO TABS: ORAL_TABLET | ORAL | Status: DC

## 2013-02-13 MED ORDER — HYDROCHLOROTHIAZIDE 25 MG PO TABS: 25.0000 mg | ORAL_TABLET | Freq: Every day | ORAL | Status: DC

## 2013-02-13 MED ORDER — LEVONORGESTREL-ETHINYL ESTRAD 0.1-20 MG-MCG PO TABS: ORAL_TABLET | ORAL | Status: DC

## 2013-02-13 NOTE — Patient Instructions (Signed)
Flu vaccine today Keep working on healthy diet and exercise for weight loss  If you are looking for a program I like weight watchers and an app called My Fitness Pal- both of these are helpful  Labs today

## 2013-02-13 NOTE — Progress Notes (Signed)
Subjective:    Patient ID: Tracey Cunningham, female    DOB: September 12, 1980, 32 y.o.   MRN: [id number]  HPI Here for health maintenance exam and to review chronic medical problems    Wt is up 13 lb with bmi of 43  Slacked off - got busy with her new house  She plans to do exercise video- including zumba   Does well with eating most of the time  Starting making green smoothies - and that works out well for her  Is getting back on track    Flu vaccine - will get today  Last pap 11/13  Menses -she is on orsythia and less bleeding/ pain and less days -- 4-5 days total  Will stay on that  Happy with that pill  Td 9/09   Patient Active Problem List   Diagnosis Date Noted  . Plant allergic contact dermatitis 11/16/2012  . Routine gynecological examination 01/31/2012  . Routine general medical examination at a health care facility 01/31/2012  . Cholelithiasis 10/08/2010  . Right sided abdominal pain 10/06/2010  . CERUMEN IMPACTION, BILATERAL 05/14/2009  . DYSPEPSIA 06/26/2008  . IRRITABLE BOWEL SYNDROME 04/23/2008  . AMENORRHEA 11/20/2007  . CONTACT DERMATITIS&OTHER ECZEMA DUE UNSPEC CAUSE 08/13/2007  . OBESITY 04/19/2007  . SYNCOPE 04/19/2007  . ANEMIA-IRON DEFICIENCY 08/24/2006  . HYPERTENSION 08/24/2006  . MENORRHALGIA 08/24/2006   Past Medical History  Diagnosis Date  . Anemia     iron deficiency   . Hypertension     borderline  . Irregular periods     OBGYN- [place] [facility]   . Obesity     weight loss with diet and exercise   . Cerumen impaction    Past Surgical History  Procedure Laterality Date  . Foot fracture      right foot   . Appendectomy  10/2005   History  Substance Use Topics  . Smoking status: Never Smoker   . Smokeless tobacco: Not on file  . Alcohol Use: Yes     Comment: wine- occ   Family History  Problem Relation Age of Onset  . Hypertension Maternal Grandfather   . Heart attack Maternal Grandfather    No Known Allergies Current Outpatient  Prescriptions on File Prior to Visit  Medication Sig Dispense Refill  . famotidine (PEPCID) 20 MG tablet Take 20 mg by mouth as needed.       . ferrous sulfate 325 (65 FE) MG tablet TAKE 1 TABLET EVERY DAY  30 tablet  1   No current facility-administered medications on file prior to visit.    Review of Systems Review of Systems  Constitutional: Negative for fever, appetite change, fatigue and unexpected weight change.  Eyes: Negative for pain and visual disturbance.  Respiratory: Negative for cough and shortness of breath.   Cardiovascular: Negative for cp or palpitations    Gastrointestinal: Negative for nausea, diarrhea and constipation.  Genitourinary: Negative for urgency and frequency.  Skin: Negative for pallor or rash   Neurological: Negative for weakness, light-headedness, numbness and headaches.  Hematological: Negative for adenopathy. Does not bruise/bleed easily.  Psychiatric/Behavioral: Negative for dysphoric mood. The patient is not nervous/anxious.         Objective:   Physical Exam  Constitutional: She appears well-developed and well-nourished. No distress.  HENT:  Head: Normocephalic and atraumatic.  Right Ear: External ear normal.  Left Ear: External ear normal.  Nose: Nose normal.  Mouth/Throat: Oropharynx is clear and moist.  Eyes: Conjunctivae and EOM are  normal. Pupils are equal, round, and reactive to light. Right eye exhibits no discharge. Left eye exhibits no discharge. No scleral icterus.  Neck: Normal range of motion. Neck supple. No JVD present. No thyromegaly present.  Cardiovascular: Normal rate, regular rhythm, normal heart sounds and intact distal pulses.  Exam reveals no gallop.   Pulmonary/Chest: Effort normal and breath sounds normal. No respiratory distress. She has no wheezes. She has no rales.  Abdominal: Soft. Bowel sounds are normal. She exhibits no distension and no mass. There is no tenderness.  Genitourinary: Vagina normal and uterus  normal. No breast swelling, tenderness, discharge or bleeding. There is no rash, tenderness or lesion on the right labia. There is no rash, tenderness or lesion on the left labia. Uterus is not enlarged and not tender. Cervix exhibits no motion tenderness, no discharge and no friability. Right adnexum displays no mass, no tenderness and no fullness. Left adnexum displays no mass, no tenderness and no fullness. No vaginal discharge found.  Breast exam: No mass, nodules, thickening, tenderness, bulging, retraction, inflamation, nipple discharge or skin changes noted.  No axillary or clavicular LA.  Chaperoned exam.    Musculoskeletal: She exhibits no edema and no tenderness.  Lymphadenopathy:    She has no cervical adenopathy.  Neurological: She is alert. She has normal reflexes. No cranial nerve deficit. She exhibits normal muscle tone. Coordination normal.  Skin: Skin is warm and dry. No rash noted. No erythema. No pallor.  Psychiatric: She has a normal mood and affect.          Assessment & Plan:

## 2013-02-13 NOTE — Progress Notes (Signed)
Pre-visit discussion using our clinic review tool. No additional management support is needed unless otherwise documented below in the visit note.  

## 2013-02-14 LAB — COMPREHENSIVE METABOLIC PANEL
ALT: 26 U/L (ref 0–35)
AST: 21 U/L (ref 0–37)
Albumin: 4.2 g/dL (ref 3.5–5.2)
Alkaline Phosphatase: 61 U/L (ref 39–117)
BUN: 8 mg/dL (ref 6–23)
CO2: 26 mEq/L (ref 19–32)
Calcium: 9 mg/dL (ref 8.4–10.5)
Chloride: 99 mEq/L (ref 96–112)
Creatinine, Ser: 0.7 mg/dL (ref 0.4–1.2)
GFR: 97.96 mL/min (ref >60.00)
Glucose, Bld: 82 mg/dL (ref 70–99)
Potassium: 3.8 mEq/L (ref 3.5–5.1)
Sodium: 136 mEq/L (ref 135–145)
Total Bilirubin: 0.3 mg/dL (ref 0.3–1.2)
Total Protein: 7.5 g/dL (ref 6.0–8.3)

## 2013-02-14 LAB — CBC WITH DIFFERENTIAL/PLATELET
Basophils Absolute: 0 10*3/uL (ref 0.0–0.1)
Basophils Relative: 0.4 % (ref 0.0–3.0)
Eosinophils Absolute: 0.2 10*3/uL (ref 0.0–0.7)
Eosinophils Relative: 1.7 % (ref 0.0–5.0)
HCT: 39.8 % (ref 36.0–46.0)
Hemoglobin: 13.4 g/dL (ref 12.0–15.0)
Lymphocytes Relative: 25.1 % (ref 12.0–46.0)
Lymphs Abs: 2.6 10*3/uL (ref 0.7–4.0)
MCHC: 33.5 g/dL (ref 30.0–36.0)
MCV: 81.6 fl (ref 78.0–100.0)
Monocytes Absolute: 0.5 10*3/uL (ref 0.1–1.0)
Monocytes Relative: 4.8 % (ref 3.0–12.0)
Neutro Abs: 6.9 10*3/uL (ref 1.4–7.7)
Neutrophils Relative %: 68 % (ref 43.0–77.0)
Platelets: 306 10*3/uL (ref 150.0–400.0)
RBC: 4.88 Mil/uL (ref 3.87–5.11)
RDW: 14.3 % (ref 11.5–14.6)
WBC: 10.2 10*3/uL (ref 4.5–10.5)

## 2013-02-14 LAB — LIPID PANEL
Cholesterol: 171 mg/dL (ref 0–200)
HDL: 51.4 mg/dL (ref >39.00)
LDL Cholesterol: 88 mg/dL (ref 0–99)
Total CHOL/HDL Ratio: 3
Triglycerides: 156 mg/dL — ABNORMAL HIGH (ref 0.0–149.0)
VLDL: 31.2 mg/dL (ref 0.0–40.0)

## 2013-02-14 LAB — TSH: TSH: 2.04 u[IU]/mL (ref 0.35–5.50)

## 2013-02-14 NOTE — Assessment & Plan Note (Signed)
BP: 128/70 mmHg   bp in fair control at this time  No changes needed Disc lifstyle change with low sodium diet and exercise   Lab today

## 2013-02-14 NOTE — Assessment & Plan Note (Signed)
Reviewed health habits including diet and exercise and skin cancer prevention Reviewed appropriate screening tests for age  Also reviewed health mt list, fam hx and immunization status , as well as social and family history   See HPI Lab today 

## 2013-02-14 NOTE — Assessment & Plan Note (Signed)
Annual exam with pap  No problems Refilled OC

## 2013-02-14 NOTE — Assessment & Plan Note (Signed)
Discussed how this problem influences overall health and the risks it imposes  Reviewed plan for weight loss with lower calorie diet (via better food choices and also portion control or program like weight watchers) and exercise building up to or more than 30 minutes 5 days per week including some aerobic activity   Disc programs like wt watchers and myfitnesspal app

## 2013-03-27 ENCOUNTER — Other Ambulatory Visit: Payer: Self-pay | Admitting: *Deleted

## 2013-03-27 MED ORDER — FERROUS SULFATE 325 (65 FE) MG PO TABS: ORAL_TABLET | ORAL | Status: DC

## 2013-12-06 ENCOUNTER — Telehealth: Payer: Self-pay | Admitting: Family Medicine

## 2013-12-06 NOTE — Telephone Encounter (Signed)
Left voicemail requesting pt to call office on Monday

## 2013-12-06 NOTE — Telephone Encounter (Signed)
Patient Information:  Caller Name: Zainah  Phone: [phone]  Patient: Tracey Cunningham, Tracey Cunningham  Gender: Female  DOB: Apr 22, 1980  Age: 33 Years  PCP: Roxy Manns [facility])  Pregnant: No  Office Follow Up:  Does the office need to follow up with this patient?: Yes  Instructions For The Office: Please call back with follow up instructions.  RN Note:  Stopped taking BCP July 2015.  No abdominal pain. No nausea or vomiting. Occasionally, drinks a glass of wine.  Reviewed information in Liver Panel Tests per [facility].  Informed MD will be given these new abnormal lab results and staff will call back to advise what follow up is recommended by the MD.  Symptoms  Reason For Call & Symptoms: Called to discuss abnormal liver function results per life insurance tests 11/27/13.  ALT  50 (NL 0-45) and AST 30 (NL 0-33).  Reports she had normal liver function results on office lab done 02/13/13.  Reviewed Health History In EMR: Yes  Reviewed Medications In EMR: Yes  Reviewed Allergies In EMR: Yes  Reviewed Surgeries / Procedures: Yes  Date of Onset of Symptoms: Unknown OB / GYN:  LMP: 11/12/2013  Guideline(s) Used:  No Protocol Available - Information Only  Disposition Per Guideline:   Discuss with PCP and Callback by Nurse Today  Reason For Disposition Reached:   Nursing judgment  Advice Given:  N/A  Patient Will Follow Care Advice:  YES

## 2013-12-06 NOTE — Telephone Encounter (Signed)
Those are very mildly elevated -and different labs tend to run a little different reference range wise  Alcohol and tylenol can cause increase in liver tests  More commonly we see a condition called fatty liver- in which someone deposits fat in their liver and weight loss will help or resolve it  I recommend repeating this in our lab when able to compare to her last draw here  Watch diet/ avoid tylenol and alcohol   If any abdominal pain -especially in the Right upper abdomen-please let me know

## 2013-12-09 NOTE — Telephone Encounter (Signed)
Pt notified of Dr. Royden Purl comments/recommedations and verbalized understanding

## 2014-01-31 ENCOUNTER — Encounter: Payer: Self-pay | Admitting: Family Medicine

## 2014-01-31 ENCOUNTER — Ambulatory Visit (INDEPENDENT_AMBULATORY_CARE_PROVIDER_SITE_OTHER): Payer: BC Managed Care – PPO | Admitting: Family Medicine

## 2014-01-31 VITALS — BP 126/74 | HR 81 | Temp 98.6°F | Ht 65.25 in | Wt 280.0 lb

## 2014-01-31 DIAGNOSIS — H6123 Impacted cerumen, bilateral: Secondary | ICD-10-CM

## 2014-01-31 NOTE — Progress Notes (Signed)
Subjective:    Patient ID: Tracey Cunningham, female    DOB: [birth year], 33 y.o.   MRN: [id number]  HPI Here with stopped up ears Hx of cerumen impaction  Was painful last week Muffled sound   No ear discharge   She uses debrox drops at home  Patient Active Problem List   Diagnosis Date Noted  . Plant allergic contact dermatitis 11/16/2012  . Routine gynecological examination 01/31/2012  . Routine general medical examination at a health care facility 01/31/2012  . Cholelithiasis 10/08/2010  . Right sided abdominal pain 10/06/2010  . CERUMEN IMPACTION, BILATERAL 05/14/2009  . DYSPEPSIA 06/26/2008  . IRRITABLE BOWEL SYNDROME 04/23/2008  . AMENORRHEA 11/20/2007  . CONTACT DERMATITIS&OTHER ECZEMA DUE UNSPEC CAUSE 08/13/2007  . OBESITY 04/19/2007  . SYNCOPE 04/19/2007  . ANEMIA-IRON DEFICIENCY 08/24/2006  . HYPERTENSION 08/24/2006  . MENORRHALGIA 08/24/2006   Past Medical History  Diagnosis Date  . Anemia     iron deficiency   . Hypertension     borderline  . Irregular periods     OBGYN- west side   . Obesity     weight loss with diet and exercise   . Cerumen impaction    Past Surgical History  Procedure Laterality Date  . Foot fracture      right foot   . Appendectomy  10/2005   History  Substance Use Topics  . Smoking status: Never Smoker   . Smokeless tobacco: Not on file  . Alcohol Use: 0.0 oz/week    0 Not specified per week     Comment: wine- occ   Family History  Problem Relation Age of Onset  . Hypertension Maternal Grandfather   . Heart attack Maternal Grandfather    No Known Allergies Current Outpatient Prescriptions on File Prior to Visit  Medication Sig Dispense Refill  . famotidine (PEPCID) 20 MG tablet Take 20 mg by mouth as needed.     . ferrous sulfate 325 (65 FE) MG tablet TAKE 1 TABLET EVERY DAY 30 tablet 6  . hydrochlorothiazide (HYDRODIURIL) 25 MG tablet TAKE 1 TABLET (25 MG TOTAL) BY MOUTH DAILY. 30 tablet 11   No current  facility-administered medications on file prior to visit.      Review of Systems Review of Systems  Constitutional: Negative for fever, appetite change, fatigue and unexpected weight change.  Eyes: Negative for pain and visual disturbance. ENT pos for bilat ear fullness and muffled sound worse on the L  Respiratory: Negative for cough and shortness of breath.   Cardiovascular: Negative for cp or palpitations    Gastrointestinal: Negative for nausea, diarrhea and constipation.  Genitourinary: Negative for urgency and frequency.  Skin: Negative for pallor or rash   Neurological: Negative for weakness, light-headedness, numbness and headaches.  Hematological: Negative for adenopathy. Does not bruise/bleed easily.  Psychiatric/Behavioral: Negative for dysphoric mood. The patient is not nervous/anxious.         Objective:   Physical Exam  Constitutional: She appears well-developed and well-nourished. No distress.  obese and well appearing   HENT:  Head: Normocephalic and atraumatic.  Mouth/Throat: Oropharynx is clear and moist.  bilat cerumen impaction L almost total  R much less  No tenderness   Eyes: Conjunctivae and EOM are normal. Pupils are equal, round, and reactive to light.  Neck: Normal range of motion. Neck supple.  Cardiovascular: Normal rate and regular rhythm.   Lymphadenopathy:    She has no cervical adenopathy.  Neurological: She is alert. No cranial  nerve deficit.  Skin: Skin is warm and dry. No rash noted. No erythema. No pallor.  Psychiatric: She has a normal mood and affect.          Assessment & Plan:   Problem List Items Addressed This Visit      Nervous and Auditory   CERUMEN IMPACTION, BILATERAL - Primary    Today worse in L ear Uses debrox at home  Resolved completely with simple ear irrigation - much improved  Pt tol well  Will update if problems or new symptoms  Continue debrox on a schedule

## 2014-01-31 NOTE — Progress Notes (Signed)
Pre visit review using our clinic review tool, if applicable. No additional management support is needed unless otherwise documented below in the visit note. 

## 2014-01-31 NOTE — Assessment & Plan Note (Addendum)
Today worse in L ear Uses debrox at home  Resolved completely with simple ear irrigation - much improved  Pt tol well  Will update if problems or new symptoms  Continue debrox on a schedule

## 2014-01-31 NOTE — Patient Instructions (Signed)
Ears look clear  If any problems / pain or otherwise let me know  Continue using debrox at home

## 2014-02-19 ENCOUNTER — Encounter: Payer: BC Managed Care – PPO | Admitting: Family Medicine

## 2014-11-05 ENCOUNTER — Ambulatory Visit (INDEPENDENT_AMBULATORY_CARE_PROVIDER_SITE_OTHER): Payer: BLUE CROSS/BLUE SHIELD | Admitting: Family Medicine

## 2014-11-05 VITALS — BP 128/72 | HR 88 | Wt 288.5 lb

## 2014-11-05 DIAGNOSIS — R198 Other specified symptoms and signs involving the digestive system and abdomen: Secondary | ICD-10-CM

## 2014-11-05 DIAGNOSIS — K219 Gastro-esophageal reflux disease without esophagitis: Secondary | ICD-10-CM | POA: Diagnosis not present

## 2014-11-05 DIAGNOSIS — R0989 Other specified symptoms and signs involving the circulatory and respiratory systems: Secondary | ICD-10-CM | POA: Insufficient documentation

## 2014-11-05 DIAGNOSIS — H6122 Impacted cerumen, left ear: Secondary | ICD-10-CM | POA: Diagnosis not present

## 2014-11-05 DIAGNOSIS — F458 Other somatoform disorders: Secondary | ICD-10-CM

## 2014-11-05 NOTE — Progress Notes (Signed)
Subjective:    Patient ID: Tracey Cunningham, female    DOB: 12/21/1980, 34 y.o.   MRN: [id number]  HPI Here for globus sensation  Clearing throat Mild sniffling but no post nasal drip  No hx of allergies  GERD has been bad with heartburn   No n/v or abd pain  No change in stools   Tomatoes make it worse   Also L ear is stopped up -needs to be flushed No ear pain   Patient Active Problem List   Diagnosis Date Noted  . Plant allergic contact dermatitis 11/16/2012  . Routine gynecological examination 01/31/2012  . Routine general medical examination at a health care facility 01/31/2012  . Cholelithiasis 10/08/2010  . Right sided abdominal pain 10/06/2010  . CERUMEN IMPACTION, BILATERAL 05/14/2009  . DYSPEPSIA 06/26/2008  . IRRITABLE BOWEL SYNDROME 04/23/2008  . AMENORRHEA 11/20/2007  . CONTACT DERMATITIS&OTHER ECZEMA DUE UNSPEC CAUSE 08/13/2007  . OBESITY 04/19/2007  . SYNCOPE 04/19/2007  . ANEMIA-IRON DEFICIENCY 08/24/2006  . HYPERTENSION 08/24/2006  . MENORRHALGIA 08/24/2006   Past Medical History  Diagnosis Date  . Anemia     iron deficiency   . Hypertension     borderline  . Irregular periods     OBGYN- west side   . Obesity     weight loss with diet and exercise   . Cerumen impaction    Past Surgical History  Procedure Laterality Date  . Foot fracture      right foot   . Appendectomy  10/2005   Social History  Substance Use Topics  . Smoking status: Never Smoker   . Smokeless tobacco: Not on file  . Alcohol Use: 0.0 oz/week    0 Standard drinks or equivalent per week     Comment: wine- occ   Family History  Problem Relation Age of Onset  . Hypertension Maternal Grandfather   . Heart attack Maternal Grandfather    No Known Allergies Current Outpatient Prescriptions on File Prior to Visit  Medication Sig Dispense Refill  . famotidine (PEPCID) 20 MG tablet Take 20 mg by mouth as needed.     . ferrous sulfate 325 (65 FE) MG tablet TAKE 1 TABLET  EVERY DAY 30 tablet 6  . hydrochlorothiazide (HYDRODIURIL) 25 MG tablet TAKE 1 TABLET (25 MG TOTAL) BY MOUTH DAILY. 30 tablet 11   No current facility-administered medications on file prior to visit.      Review of Systems    Review of Systems  Constitutional: Negative for fever, appetite change,  and unexpected weight change.  ENT neg for ST or post nasal drip or sinus pain  Eyes: Negative for pain and visual disturbance.  Respiratory: Negative for cough and shortness of breath.  neg for wheeze  Cardiovascular: Negative for cp or palpitations    Gastrointestinal: Negative for nausea, diarrhea and constipation. pos for heartburn, neg for melena  Genitourinary: Negative for urgency and frequency.  Skin: Negative for pallor or rash   Neurological: Negative for weakness, light-headedness, numbness and headaches.  Hematological: Negative for adenopathy. Does not bruise/bleed easily.  Psychiatric/Behavioral: Negative for dysphoric mood. The patient is not nervous/anxious.      Objective:   Physical Exam  Constitutional: She appears well-developed and well-nourished. No distress.  obese and well appearing   HENT:  Head: Normocephalic and atraumatic.  Nose: Nose normal.  Mouth/Throat: Oropharynx is clear and moist. No oropharyngeal exudate.  R TM clear/canal clear  L canal - cerumen impaction  S/p irrigation -  small amt remains but mostly clear with improvement in hearing     Eyes: Conjunctivae and EOM are normal. Pupils are equal, round, and reactive to light. No scleral icterus.  Neck: Normal range of motion. Neck supple.  Cardiovascular: Normal rate, regular rhythm and normal heart sounds.   Pulmonary/Chest: Effort normal and breath sounds normal. No respiratory distress. She has no wheezes. She has no rales.  Abdominal: Soft. Bowel sounds are normal. She exhibits no distension and no mass. There is no tenderness. There is no rebound and no guarding.  Lymphadenopathy:    She  has no cervical adenopathy.  Neurological: She is alert.  Skin: Skin is warm and dry. No erythema. No pallor.  Psychiatric: She has a normal mood and affect.          Assessment & Plan:   Problem List Items Addressed This Visit    CERUMEN IMPACTION, BILATERAL - Primary    L side this time Uses debrox  Improved with simple ear irrigation - and hearing is better  Enc to continue debrox and f/u for irrigation prn       GERD (gastroesophageal reflux disease)    More heartburn not controlled by pepcid  Also globus sens in throat  Will try omeprazole otc 20 mg daily in am  If no imp in either symptoms in 2 weeks will update Given handout on diet for gerd as well Enc wt loss       Globus sensation    Likely due to GERD with heartburn  Rev lifestyle change and diet Wt loss enc  Start omeprazole 20 mg daily instead of pepcid Update in 2 wk if not resolved (consider ENT)

## 2014-11-05 NOTE — Assessment & Plan Note (Signed)
L side this time Uses debrox  Improved with simple ear irrigation - and hearing is better  Enc to continue debrox and f/u for irrigation prn

## 2014-11-05 NOTE — Assessment & Plan Note (Signed)
Likely due to GERD with heartburn  Rev lifestyle change and diet Wt loss enc  Start omeprazole 20 mg daily instead of pepcid Update in 2 wk if not resolved (consider ENT)

## 2014-11-05 NOTE — Progress Notes (Signed)
Pre visit review using our clinic review tool, if applicable. No additional management support is needed unless otherwise documented below in the visit note. 

## 2014-11-05 NOTE — Patient Instructions (Signed)
Your left ear hearing should improve with time Keep using debrox  Stop pepcid and start over the counter omeprazole (prilosec) 20 mg once daily in the am  If no improvement with the throat symptoms in 2 weeks - contact me (we may need you to see a specialist)  Also - if not heartburn free - let me know

## 2014-11-05 NOTE — Assessment & Plan Note (Signed)
More heartburn not controlled by pepcid  Also globus sens in throat  Will try omeprazole otc 20 mg daily in am  If no imp in either symptoms in 2 weeks will update Given handout on diet for gerd as well Enc wt loss

## 2014-11-20 ENCOUNTER — Telehealth: Payer: Self-pay

## 2014-11-20 DIAGNOSIS — R0989 Other specified symptoms and signs involving the circulatory and respiratory systems: Secondary | ICD-10-CM

## 2014-11-20 DIAGNOSIS — R198 Other specified symptoms and signs involving the digestive system and abdomen: Secondary | ICD-10-CM

## 2014-11-20 NOTE — Telephone Encounter (Signed)
Go up to twice a day on the prilosec this week -  bid  Update me next week If the globus sensation does not go away I will likely refer her to ENT at that point

## 2014-11-20 NOTE — Telephone Encounter (Signed)
Pt left v/m; pt seen 11/05/14; pt has been taking prilosec, pt still has same feeling in her throat but has not had indigestion since starting prilosec.

## 2014-11-21 NOTE — Telephone Encounter (Signed)
Pt notified of Dr. Royden Purl instructions and verbalized understanding. She will update [place] next week on how she is feeling

## 2014-11-28 NOTE — Telephone Encounter (Signed)
Pt agrees with ENT referral, I advise pt our [facility] will call to schedule appt., please put referral in

## 2014-11-28 NOTE — Telephone Encounter (Signed)
I recommend ENT ref-is she ok with that?

## 2014-11-28 NOTE — Telephone Encounter (Signed)
Pt left v/m; pt taking prilosec bid for one week and no improvement in feeling in her throat; pt request cb.

## 2014-11-30 NOTE — Telephone Encounter (Signed)
Ref to ENT 

## 2014-11-30 NOTE — Addendum Note (Signed)
Addended by: Roxy Manns A on: 11/30/2014 01:27 AM   Modules accepted: Orders

## 2014-12-01 NOTE — Telephone Encounter (Signed)
Appt made and patient aware.  

## 2016-02-02 ENCOUNTER — Encounter: Payer: Self-pay | Admitting: Family Medicine

## 2016-02-02 ENCOUNTER — Ambulatory Visit (INDEPENDENT_AMBULATORY_CARE_PROVIDER_SITE_OTHER): Payer: BLUE CROSS/BLUE SHIELD | Admitting: Family Medicine

## 2016-02-02 VITALS — BP 134/82 | HR 74 | Temp 98.2°F | Ht 65.5 in | Wt 297.5 lb

## 2016-02-02 DIAGNOSIS — Z0000 Encounter for general adult medical examination without abnormal findings: Secondary | ICD-10-CM | POA: Diagnosis not present

## 2016-02-02 DIAGNOSIS — I10 Essential (primary) hypertension: Secondary | ICD-10-CM

## 2016-02-02 DIAGNOSIS — Z23 Encounter for immunization: Secondary | ICD-10-CM | POA: Diagnosis not present

## 2016-02-02 DIAGNOSIS — Z803 Family history of malignant neoplasm of breast: Secondary | ICD-10-CM

## 2016-02-02 DIAGNOSIS — N946 Dysmenorrhea, unspecified: Secondary | ICD-10-CM

## 2016-02-02 LAB — CBC WITH DIFFERENTIAL/PLATELET
Basophils Absolute: 0 10*3/uL (ref 0.0–0.1)
Basophils Relative: 0.5 % (ref 0.0–3.0)
Eosinophils Absolute: 0.1 10*3/uL (ref 0.0–0.7)
Eosinophils Relative: 1.6 % (ref 0.0–5.0)
HCT: 37.1 % (ref 36.0–46.0)
Hemoglobin: 12.1 g/dL (ref 12.0–15.0)
Lymphocytes Relative: 25.6 % (ref 12.0–46.0)
Lymphs Abs: 2 10*3/uL (ref 0.7–4.0)
MCHC: 32.7 g/dL (ref 30.0–36.0)
MCV: 77.7 fl — ABNORMAL LOW (ref 78.0–100.0)
Monocytes Absolute: 0.4 10*3/uL (ref 0.1–1.0)
Monocytes Relative: 5.6 % (ref 3.0–12.0)
Neutro Abs: 5.3 10*3/uL (ref 1.4–7.7)
Neutrophils Relative %: 66.7 % (ref 43.0–77.0)
Platelets: 278 10*3/uL (ref 150.0–400.0)
RBC: 4.78 Mil/uL (ref 3.87–5.11)
RDW: 15.9 % — ABNORMAL HIGH (ref 11.5–15.5)
WBC: 8 10*3/uL (ref 4.0–10.5)

## 2016-02-02 LAB — COMPREHENSIVE METABOLIC PANEL
ALT: 29 U/L (ref 0–35)
AST: 21 U/L (ref 0–37)
Albumin: 4.2 g/dL (ref 3.5–5.2)
Alkaline Phosphatase: 79 U/L (ref 39–117)
BUN: 10 mg/dL (ref 6–23)
CO2: 26 mEq/L (ref 19–32)
Calcium: 9 mg/dL (ref 8.4–10.5)
Chloride: 106 mEq/L (ref 96–112)
Creatinine, Ser: 0.76 mg/dL (ref 0.40–1.20)
GFR: 91.87 mL/min (ref >60.00)
Glucose, Bld: 90 mg/dL (ref 70–99)
Potassium: 4.3 mEq/L (ref 3.5–5.1)
Sodium: 138 mEq/L (ref 135–145)
Total Bilirubin: 0.4 mg/dL (ref 0.2–1.2)
Total Protein: 7 g/dL (ref 6.0–8.3)

## 2016-02-02 LAB — LIPID PANEL
Cholesterol: 143 mg/dL (ref 0–200)
HDL: 49.4 mg/dL (ref >39.00)
LDL Cholesterol: 73 mg/dL (ref 0–99)
NonHDL: 93.26
Total CHOL/HDL Ratio: 3
Triglycerides: 99 mg/dL (ref 0.0–149.0)
VLDL: 19.8 mg/dL (ref 0.0–40.0)

## 2016-02-02 NOTE — Progress Notes (Signed)
Pre visit review using our clinic review tool, if applicable. No additional management support is needed unless otherwise documented below in the visit note. 

## 2016-02-02 NOTE — Patient Instructions (Addendum)
Stop at check out for ref to gyn and for mammogram  Look on line for the "couch to 5K program"  For weight loss - cut portions by 1/3 Gradually get away from sweets and processed carbohydrates as much as you can for weight loss  Goal -exercise 5 days per week/ build time as you go  Flu shot today  Labs today

## 2016-02-02 NOTE — Progress Notes (Signed)
Subjective:    Patient ID: Tracey Cunningham, female    DOB: 09-Jun-1980, 35 y.o.   MRN: [id number]  HPI Here for health maintenance exam and to review chronic medical problems    Feeling good   HIV screening -not interested   Flu shot - will get today   Pap 12/14-negative  Has not had one since then  Has not been to gyn  Started spotting yesterday - now starting menses   Menses- tend to be irregular  Heavy flow Had to stop OC due to blood pressure  Not lasting as long- last 4-5 days  She is not taking iron (anemic in the past)  Not trying to get pregnant  Would consider IUD  No skipped menses    Tetanus vaccine 9/09  Wt Readings from Last 3 Encounters:  02/02/16 297 lb 8 oz (134.9 kg)  11/05/14 288 lb 8 oz (130.9 kg)  01/31/14 280 lb (127 kg)  wt is up - she has been eating "badly"- too many starches  Portions are too big  Does not exercise  bmi is 48.75-morbid obese range   She knows she needs to loose wt for health  She is going to start training for a run in a year - 5K    Mother has breast cancer (triple negative)  She has not had a mammogram  Wants to do one No changes on self exam     bp is stable today  Better off OC  No cp or palpitations or headaches or edema  No side effects to medicines  BP Readings from Last 3 Encounters:  02/02/16 134/82  11/05/14 128/72  01/31/14 126/74     She was previously on hctz   Hx of anemia in the past   Due for labs today   Patient Active Problem List   Diagnosis Date Noted  . Family history of breast cancer in mother 02/02/2016  . Globus sensation 11/05/2014  . GERD (gastroesophageal reflux disease) 11/05/2014  . Plant allergic contact dermatitis 11/16/2012  . Routine gynecological examination 01/31/2012  . Routine general medical examination at a health care facility 01/31/2012  . Cholelithiasis 10/08/2010  . Right sided abdominal pain 10/06/2010  . CERUMEN IMPACTION, BILATERAL 05/14/2009  .  DYSPEPSIA 06/26/2008  . IRRITABLE BOWEL SYNDROME 04/23/2008  . CONTACT DERMATITIS&OTHER ECZEMA DUE UNSPEC CAUSE 08/13/2007  . Morbid obesity (HCC) 04/19/2007  . SYNCOPE 04/19/2007  . ANEMIA-IRON DEFICIENCY 08/24/2006  . Essential hypertension 08/24/2006  . MENORRHALGIA 08/24/2006   Past Medical History:  Diagnosis Date  . Anemia    iron deficiency   . Cerumen impaction   . Hypertension    borderline  . Irregular periods    OBGYN- west side   . Obesity    weight loss with diet and exercise    Past Surgical History:  Procedure Laterality Date  . APPENDECTOMY  10/2005  . foot fracture     right foot    Social History  Substance Use Topics  . Smoking status: Never Smoker  . Smokeless tobacco: Never Used  . Alcohol use 0.0 oz/week     Comment: wine- occ   Family History  Problem Relation Age of Onset  . Hypertension Maternal Grandfather   . Heart attack Maternal Grandfather   . Breast cancer Mother   . Breast cancer Maternal Grandmother    No Known Allergies Current Outpatient Prescriptions on File Prior to Visit  Medication Sig Dispense Refill  . famotidine (PEPCID) 20 MG  tablet Take 20 mg by mouth as needed.      No current facility-administered medications on file prior to visit.     Review of Systems    Review of Systems  Constitutional: Negative for fever, appetite change, fatigue and unexpected weight change.  Eyes: Negative for pain and visual disturbance.  Respiratory: Negative for cough and shortness of breath.   Cardiovascular: Negative for cp or palpitations    Gastrointestinal: Negative for nausea, diarrhea and constipation.  Genitourinary: Negative for urgency and frequency.  Skin: Negative for pallor or rash   Neurological: Negative for weakness, light-headedness, numbness and headaches.  Hematological: Negative for adenopathy. Does not bruise/bleed easily.  Psychiatric/Behavioral: Negative for dysphoric mood. The patient is not nervous/anxious.       Objective:   Physical Exam  Constitutional: She appears well-developed and well-nourished. No distress.  Morbidly obese and well appearing   HENT:  Head: Normocephalic and atraumatic.  Right Ear: External ear normal.  Left Ear: External ear normal.  Mouth/Throat: Oropharynx is clear and moist.  Eyes: Conjunctivae and EOM are normal. Pupils are equal, round, and reactive to light. No scleral icterus.  Neck: Normal range of motion. Neck supple. No JVD present. Carotid bruit is not present. No thyromegaly present.  Cardiovascular: Normal rate, regular rhythm, normal heart sounds and intact distal pulses.  Exam reveals no gallop.   Pulmonary/Chest: Effort normal and breath sounds normal. No respiratory distress. She has no wheezes. She exhibits no tenderness.  Abdominal: Soft. Bowel sounds are normal. She exhibits no distension, no abdominal bruit and no mass. There is no tenderness.  Genitourinary: No breast swelling, tenderness, discharge or bleeding.  Genitourinary Comments: Breast exam: No mass, nodules, thickening, tenderness, bulging, retraction, inflamation, nipple discharge or skin changes noted.  No axillary or clavicular LA.      Musculoskeletal: Normal range of motion. She exhibits no edema or tenderness.  Lymphadenopathy:    She has no cervical adenopathy.  Neurological: She is alert. She has normal reflexes. No cranial nerve deficit. She exhibits normal muscle tone. Coordination normal.  Skin: Skin is warm and dry. No rash noted. No erythema. No pallor.  Psychiatric: She has a normal mood and affect.          Assessment & Plan:   Problem List Items Addressed This Visit      Cardiovascular and Mediastinum   Essential hypertension    bp in fair control at this time  BP Readings from Last 1 Encounters:  02/02/16 134/82   No changes needed Disc lifstyle change with low sodium diet and exercise   Labs reviewed and wt loss encouraged         Genitourinary    MENORRHALGIA    Pt cannot take OC due to blood pressure  Will ref to gyn for disc of poss IUD to help this and also for annual gyn exam      Relevant Orders   Ambulatory referral to Gynecology     Other   Routine general medical examination at a health care facility - Primary    Reviewed health habits including diet and exercise and skin cancer prevention Reviewed appropriate screening tests for age  Also reviewed health mt list, fam hx and immunization status , as well as social and family history   See HPi Wt loss and fitness enc  Ref to gyn for menorrhagia  Ref for screening mammogram  Flu shot today      Relevant Orders   Comprehensive metabolic  panel (Completed)   CBC with Differential/Platelet (Completed)   Lipid panel (Completed)   Morbid obesity (HCC)    Discussed how this problem influences overall health and the risks it imposes  Reviewed plan for weight loss with lower calorie diet (via better food choices and also portion control or program like weight watchers) and exercise building up to or more than 30 minutes 5 days per week including some aerobic activity   Has been eating badly- enc to change habits  She is planning to train for 5K race and change her diet       Family history of breast cancer in mother    Scheduled annual screening mammogram Nl breast exam today  Encouraged monthly self exams        Relevant Orders   MM DIGITAL SCREENING BILATERAL    Other Visit Diagnoses    Need for influenza vaccination       Relevant Orders   Flu Vaccine QUAD 36+ mos IM (Completed)

## 2016-02-07 NOTE — Assessment & Plan Note (Signed)
bp in fair control at this time  BP Readings from Last 1 Encounters:  02/02/16 134/82   No changes needed Disc lifstyle change with low sodium diet and exercise   Labs reviewed and wt loss encouraged

## 2016-02-07 NOTE — Assessment & Plan Note (Signed)
Reviewed health habits including diet and exercise and skin cancer prevention Reviewed appropriate screening tests for age  Also reviewed health mt list, fam hx and immunization status , as well as social and family history   See HPi Wt loss and fitness enc  Ref to gyn for menorrhagia  Ref for screening mammogram  Flu shot today

## 2016-02-07 NOTE — Assessment & Plan Note (Signed)
Discussed how this problem influences overall health and the risks it imposes  Reviewed plan for weight loss with lower calorie diet (via better food choices and also portion control or program like weight watchers) and exercise building up to or more than 30 minutes 5 days per week including some aerobic activity   Has been eating badly- enc to change habits  She is planning to train for 5K race and change her diet

## 2016-02-07 NOTE — Assessment & Plan Note (Signed)
Pt cannot take OC due to blood pressure  Will ref to gyn for disc of poss IUD to help this and also for annual gyn exam

## 2016-02-07 NOTE — Assessment & Plan Note (Signed)
Scheduled annual screening mammogram Nl breast exam today  Encouraged monthly self exams   

## 2016-03-16 ENCOUNTER — Ambulatory Visit
Admission: RE | Admit: 2016-03-16 | Discharge: 2016-03-16 | Disposition: A | Discharge status: Home / Self Care | Payer: BLUE CROSS/BLUE SHIELD | Source: Ambulatory Visit | Attending: Family Medicine | Admitting: Family Medicine

## 2016-03-16 DIAGNOSIS — Z1231 Encounter for screening mammogram for malignant neoplasm of breast: Secondary | ICD-10-CM | POA: Insufficient documentation

## 2016-03-16 DIAGNOSIS — Z803 Family history of malignant neoplasm of breast: Secondary | ICD-10-CM

## 2016-03-21 DIAGNOSIS — H5213 Myopia, bilateral: Secondary | ICD-10-CM | POA: Diagnosis not present

## 2016-04-20 DIAGNOSIS — Z1151 Encounter for screening for human papillomavirus (HPV): Secondary | ICD-10-CM | POA: Diagnosis not present

## 2016-04-20 DIAGNOSIS — Z124 Encounter for screening for malignant neoplasm of cervix: Secondary | ICD-10-CM | POA: Diagnosis not present

## 2016-04-20 DIAGNOSIS — N921 Excessive and frequent menstruation with irregular cycle: Secondary | ICD-10-CM | POA: Diagnosis not present

## 2016-07-26 ENCOUNTER — Telehealth: Payer: BLUE CROSS/BLUE SHIELD | Admitting: Family

## 2016-07-26 DIAGNOSIS — L237 Allergic contact dermatitis due to plants, except food: Secondary | ICD-10-CM

## 2016-07-26 MED ORDER — METHYLPREDNISOLONE 4 MG PO TBPK: ORAL_TABLET | ORAL | 0 refills | Status: DC

## 2016-07-26 NOTE — Progress Notes (Signed)

## 2016-10-24 ENCOUNTER — Telehealth: Payer: BLUE CROSS/BLUE SHIELD | Admitting: Nurse Practitioner

## 2016-10-24 DIAGNOSIS — R110 Nausea: Secondary | ICD-10-CM

## 2016-10-24 DIAGNOSIS — R1032 Left lower quadrant pain: Secondary | ICD-10-CM

## 2016-10-24 NOTE — Progress Notes (Signed)
Based on what you shared with me it looks like you have a serious condition that should be evaluated in a face to face office visit.  NOTE: Even if you have entered your credit card information for this eVisit, you will not be charged.   If you are having a true medical emergency please call 911.  If you need an urgent face to face visit, [facility] has four urgent care centers for your convenience.  If you need care fast and have a high deductible or no insurance consider:   [email or web address]  [phone]  [address], [address] [place], [place] [address] 8 am to 8 pm Monday-Friday 10 am to 4 pm Saturday-Sunday   The following sites will take your  insurance:    . [facility]  [phone] Get Driving Directions Find a Provider at this Location  [address] [place], [place] [address] . 10 am to 8 pm Monday-Friday . 12 pm to 8 pm Saturday-Sunday   . [facility]  [phone] Get Driving Directions Find a Provider at this Location  [address], [address] [place], [place] [address] . 8 am to 8 pm Monday-Friday . 9 am to 6 pm Saturday . 11 am to 6 pm Sunday   .  [facility] [place]  [phone] Get Driving Directions  [address] [place], [place] [address] . 8 am to 8 pm Monday-Friday . 8 am to 4 pm Saturday-Sunday   Your e-visit answers were reviewed by a board certified advanced clinical practitioner to complete your personal care plan.  Thank you for using e-Visits.  

## 2016-10-25 ENCOUNTER — Ambulatory Visit (INDEPENDENT_AMBULATORY_CARE_PROVIDER_SITE_OTHER): Payer: BLUE CROSS/BLUE SHIELD | Admitting: Primary Care

## 2016-10-25 ENCOUNTER — Encounter: Payer: Self-pay | Admitting: Primary Care

## 2016-10-25 VITALS — BP 124/78 | HR 94 | Temp 98.4°F | Ht 65.5 in | Wt 295.8 lb

## 2016-10-25 DIAGNOSIS — R110 Nausea: Secondary | ICD-10-CM

## 2016-10-25 DIAGNOSIS — R1032 Left lower quadrant pain: Secondary | ICD-10-CM | POA: Diagnosis not present

## 2016-10-25 LAB — COMPREHENSIVE METABOLIC PANEL
ALT: 36 U/L — ABNORMAL HIGH (ref 0–35)
AST: 22 U/L (ref 0–37)
Albumin: 4.2 g/dL (ref 3.5–5.2)
Alkaline Phosphatase: 75 U/L (ref 39–117)
BUN: 10 mg/dL (ref 6–23)
CO2: 26 mEq/L (ref 19–32)
Calcium: 9.4 mg/dL (ref 8.4–10.5)
Chloride: 105 mEq/L (ref 96–112)
Creatinine, Ser: 0.81 mg/dL (ref 0.40–1.20)
GFR: 85 mL/min (ref >60.00)
Glucose, Bld: 103 mg/dL — ABNORMAL HIGH (ref 70–99)
Potassium: 4.5 mEq/L (ref 3.5–5.1)
Sodium: 138 mEq/L (ref 135–145)
Total Bilirubin: 0.5 mg/dL (ref 0.2–1.2)
Total Protein: 6.5 g/dL (ref 6.0–8.3)

## 2016-10-25 LAB — CBC WITH DIFFERENTIAL/PLATELET
Basophils Absolute: 0 10*3/uL (ref 0.0–0.1)
Basophils Relative: 0.5 % (ref 0.0–3.0)
Eosinophils Absolute: 0.1 10*3/uL (ref 0.0–0.7)
Eosinophils Relative: 1.6 % (ref 0.0–5.0)
HCT: 37.2 % (ref 36.0–46.0)
Hemoglobin: 12.1 g/dL (ref 12.0–15.0)
Lymphocytes Relative: 27 % (ref 12.0–46.0)
Lymphs Abs: 1.7 10*3/uL (ref 0.7–4.0)
MCHC: 32.5 g/dL (ref 30.0–36.0)
MCV: 79.7 fl (ref 78.0–100.0)
Monocytes Absolute: 0.4 10*3/uL (ref 0.1–1.0)
Monocytes Relative: 6.2 % (ref 3.0–12.0)
Neutro Abs: 4.1 10*3/uL (ref 1.4–7.7)
Neutrophils Relative %: 64.7 % (ref 43.0–77.0)
Platelets: 290 10*3/uL (ref 150.0–400.0)
RBC: 4.67 Mil/uL (ref 3.87–5.11)
RDW: 15.8 % — ABNORMAL HIGH (ref 11.5–15.5)
WBC: 6.3 10*3/uL (ref 4.0–10.5)

## 2016-10-25 LAB — POC URINALSYSI DIPSTICK (AUTOMATED)
Bilirubin, UA: NEGATIVE
Blood, UA: NEGATIVE
Clarity, UA: NEGATIVE
Glucose, UA: NEGATIVE
Ketones, UA: NEGATIVE
Leukocytes, UA: NEGATIVE
Nitrite, UA: NEGATIVE
Protein, UA: NEGATIVE
Spec Grav, UA: >=1.03 — AB (ref 1.010–1.025)
Urobilinogen, UA: 0.2 E.U./dL
pH, UA: 5.5 (ref 5.0–8.0)

## 2016-10-25 MED ORDER — ONDANSETRON 4 MG PO TBDP: 4.0000 mg | ORAL_TABLET | Freq: Three times a day (TID) | ORAL | 0 refills | Status: DC | PRN

## 2016-10-25 NOTE — Progress Notes (Signed)
Subjective:    Patient ID: Tracey Cunningham, female    DOB: [birth year], 36 y.o.   MRN: [id number]  HPI  [name] is a 36 year old female with a history of IBS, GERD, s/p appendectomy in 2007 who presents today with a chief complaint of abdominal pain. Her pain is located to the left lower quadrant and left flank that began four days ago. No radiation. She also reports nausea that began three days ago, no vomiting. Her pain is intermittent, last night she experienced a different type of "shooting pains" from the LLQ to the left groin, mostly her pain is achy and intermittent. She denies fevers, bloody stools, hematuria, urinary frequency, vaginal symptoms. She's been taking Pepcid and Ibuprofen without much improvement. Overall she's feeling about the same. She's been eating and drinking some, nausea has affected this.   Review of Systems  Constitutional: Negative for fever.  Gastrointestinal: Positive for abdominal pain and nausea. Negative for constipation, diarrhea and vomiting.  Genitourinary: Negative for dysuria, frequency, hematuria and vaginal discharge.       Past Medical History:  Diagnosis Date  . Anemia    iron deficiency   . Cerumen impaction   . Hypertension    borderline  . Irregular periods    [facility]   . Obesity    weight loss with diet and exercise      Social History   Social History  . Marital status: Single    Spouse name: N/A  . Number of children: N/A  . Years of education: N/A   Occupational History  . Not on file.   Social History Main Topics  . Smoking status: Never Smoker  . Smokeless tobacco: Never Used  . Alcohol use 0.0 oz/week     Comment: wine- occ  . Drug use: No  . Sexual activity: Not on file   Other Topics Concern  . Not on file   Social History Narrative  . No narrative on file    Past Surgical History:  Procedure Laterality Date  . APPENDECTOMY  10/2005  . foot fracture     right foot     Family History    Problem Relation Age of Onset  . Hypertension Maternal Grandfather   . Heart attack Maternal Grandfather   . Breast cancer Mother 45  . Breast cancer Maternal Grandmother        70's  . Breast cancer Paternal Grandmother        58's    No Known Allergies  Current Outpatient Prescriptions on File Prior to Visit  Medication Sig Dispense Refill  . famotidine (PEPCID) 20 MG tablet Take 20 mg by mouth as needed.      No current facility-administered medications on file prior to visit.     BP 124/78   Pulse 94   Temp 98.4 F (36.9 C) (Oral)   Ht 5' 5.5" (1.664 m)   Wt 295 lb 12.8 oz (134.2 kg)   LMP 10/05/2016   SpO2 98%   BMI 48.48 kg/m    Objective:   Physical Exam  Constitutional: She does not appear ill.  Neck: Neck supple.  Cardiovascular: Normal rate.   Pulmonary/Chest: Effort normal.  Abdominal: Soft. Bowel sounds are normal. There is tenderness in the left lower quadrant.  Skin: Skin is warm and dry.          Assessment & Plan:  Abdominal Pain:  Located to left lower quadrant x four days also to  left flank. Exam today with LLQ tenderness, no CVA tenderness. Check UA today: Negative for leuks, blood, nitrites. Check CBC with diff and CMP. Could be IBS, constipation. Low suspicion for diverticulitis, will check labs. Rx for Zofran provided for nausea so that she stay hydrated.  Return precautions provided.  Morrie Sheldonlark,Jakira Mcfadden Kendal, NP

## 2016-10-25 NOTE — Addendum Note (Signed)
Addended by: Tawnya CrookSAMBATH, Rigel Filsinger on: 10/25/2016 09:09 AM   Modules accepted: Orders

## 2016-10-25 NOTE — Patient Instructions (Signed)
Complete lab work prior to leaving today. I will notify you of your results once received.   You may take ondansetron (Zofran) tablets as needed for nausea. Melt one tablet under your tongue every 8 hours as needed for nausea.  Ensure you are staying hydrated with water.  It was a pleasure meeting you!

## 2016-11-09 DIAGNOSIS — L237 Allergic contact dermatitis due to plants, except food: Secondary | ICD-10-CM | POA: Diagnosis not present

## 2016-11-09 DIAGNOSIS — L309 Dermatitis, unspecified: Secondary | ICD-10-CM | POA: Diagnosis not present

## 2016-11-18 DIAGNOSIS — L309 Dermatitis, unspecified: Secondary | ICD-10-CM | POA: Diagnosis not present

## 2016-11-18 DIAGNOSIS — L237 Allergic contact dermatitis due to plants, except food: Secondary | ICD-10-CM | POA: Diagnosis not present

## 2016-11-30 DIAGNOSIS — D225 Melanocytic nevi of trunk: Secondary | ICD-10-CM | POA: Diagnosis not present

## 2016-11-30 DIAGNOSIS — B078 Other viral warts: Secondary | ICD-10-CM | POA: Diagnosis not present

## 2016-12-20 DIAGNOSIS — M25572 Pain in left ankle and joints of left foot: Secondary | ICD-10-CM | POA: Diagnosis not present

## 2016-12-20 DIAGNOSIS — S86112A Strain of other muscle(s) and tendon(s) of posterior muscle group at lower leg level, left leg, initial encounter: Secondary | ICD-10-CM | POA: Diagnosis not present

## 2016-12-27 ENCOUNTER — Ambulatory Visit: Payer: Self-pay | Admitting: Podiatry

## 2018-04-10 ENCOUNTER — Encounter: Payer: Self-pay | Admitting: Internal Medicine

## 2018-04-10 ENCOUNTER — Ambulatory Visit: Payer: BLUE CROSS/BLUE SHIELD | Admitting: Internal Medicine

## 2018-04-10 VITALS — BP 130/80 | HR 100 | Temp 98.0°F | Wt 317.0 lb

## 2018-04-10 DIAGNOSIS — R059 Cough, unspecified: Secondary | ICD-10-CM

## 2018-04-10 DIAGNOSIS — R0981 Nasal congestion: Secondary | ICD-10-CM

## 2018-04-10 DIAGNOSIS — R05 Cough: Secondary | ICD-10-CM

## 2018-04-10 DIAGNOSIS — B9789 Other viral agents as the cause of diseases classified elsewhere: Secondary | ICD-10-CM

## 2018-04-10 DIAGNOSIS — J069 Acute upper respiratory infection, unspecified: Secondary | ICD-10-CM | POA: Diagnosis not present

## 2018-04-10 NOTE — Progress Notes (Signed)
HPI  Pt presents to the clinic today with c/o nasal congestion and cough. She reports this started 3-4 days ago. She is blowing clear mucous out of her nose. The cough is productive of yellow mucous. She denies headache, runny nose, ear pain, sore throat or shortness of breath. She denies fever, chills or body aches. She has tried Mucinex with minimal relief. She has no history of allergies. She has had sick contacts diagnosed with the flu. She did not get her flu shot this year.  Review of Systems      Past Medical History:  Diagnosis Date  . Anemia    iron deficiency   . Cerumen impaction   . Hypertension    borderline  . Irregular periods    [facility]   . Obesity    weight loss with diet and exercise     Family History  Problem Relation Age of Onset  . Hypertension Maternal Grandfather   . Heart attack Maternal Grandfather   . Breast cancer Mother [id number]  . Breast cancer Maternal Grandmother        70's  . Breast cancer Paternal Grandmother        2470's    Social History   Socioeconomic History  . Marital status: Single    Spouse name: Not on file  . Number of children: Not on file  . Years of education: Not on file  . Highest education level: Not on file  Occupational History  . Not on file  Social Needs  . Financial resource strain: Not on file  . Food insecurity:    Worry: Not on file    Inability: Not on file  . Transportation needs:    Medical: Not on file    Non-medical: Not on file  Tobacco Use  . Smoking status: Never Smoker  . Smokeless tobacco: Never Used  Substance and Sexual Activity  . Alcohol use: Yes    Alcohol/week: 0.0 standard drinks    Comment: wine- occ  . Drug use: No  . Sexual activity: Not on file  Lifestyle  . Physical activity:    Days per week: Not on file    Minutes per session: Not on file  . Stress: Not on file  Relationships  . Social connections:    Talks on phone: Not on file    Gets together: Not on file     Attends religious service: Not on file    Active member of club or organization: Not on file    Attends meetings of clubs or organizations: Not on file    Relationship status: Not on file  . Intimate partner violence:    Fear of current or ex partner: Not on file    Emotionally abused: Not on file    Physically abused: Not on file    Forced sexual activity: Not on file  Other Topics Concern  . Not on file  Social History Narrative  . Not on file    No Known Allergies   Constitutional: Positive headache, fatigue and fever. Denies headache, fatigue, fever or abrupt weight changes.  HEENT:  Positive nasal congestion. Denies eye redness, eye pain, pressure behind the eyes, facial pain, ear pain, ringing in the ears, wax buildup, runny nose or sore throat. Respiratory: Positive cough. Denies difficulty breathing or shortness of breath.  Cardiovascular: Denies chest pain, chest tightness, palpitations or swelling in the hands or feet.   No other specific complaints in a complete review of  systems (except as listed in HPI above).  Objective:   BP 130/80   Pulse 100   Temp 98 F (36.7 C) (Oral)   Wt (!) 317 lb (143.8 kg)   SpO2 97%   BMI 51.95 kg/m   Wt Readings from Last 3 Encounters:  10/25/16 295 lb 12.8 oz (134.2 kg)  02/02/16 297 lb 8 oz (134.9 kg)  11/05/14 288 lb 8 oz (130.9 kg)     General: Appears her stated age, obese, in NAD. HEENT: Head: normal shape and size, no sinus tenderness noted;Ears: Tm's gray and intact, normal light reflex; Nose: mucosa pink and moist, septum midline; Throat/Mouth:  Teeth present, mucosa erythematous and moist, no exudate noted, no lesions or ulcerations noted.  Neck: No cervical lymphadenopathy.  Cardiovascular: Normal rate and rhythm. S1,S2 noted.  No murmur, rubs or gallops noted.  Pulmonary/Chest: Normal effort and positive vesicular breath sounds. No respiratory distress. No wheezes, rales or ronchi noted.       Assessment & Plan:    Viral URI with Cough:  Rapid Flu: negative Get some rest and drink plenty of water Start Flonase and Zyrtec OTC Delsym as needed for cough  RTC as needed or if symptoms persist.   Nicki Reaper, NP

## 2018-04-10 NOTE — Patient Instructions (Signed)

## 2018-05-15 ENCOUNTER — Encounter: Payer: Self-pay | Admitting: Family Medicine

## 2018-05-15 ENCOUNTER — Ambulatory Visit: Payer: BLUE CROSS/BLUE SHIELD | Admitting: Family Medicine

## 2018-05-15 VITALS — BP 140/76 | HR 88 | Temp 98.0°F | Ht 66.0 in | Wt 316.2 lb

## 2018-05-15 DIAGNOSIS — J069 Acute upper respiratory infection, unspecified: Secondary | ICD-10-CM

## 2018-05-15 DIAGNOSIS — R42 Dizziness and giddiness: Secondary | ICD-10-CM | POA: Diagnosis not present

## 2018-05-15 NOTE — Progress Notes (Signed)
Subjective:     Tracey Cunningham is a 38 y.o. female presenting for Dizziness (started after return from [facility] last thurday. ST started friday. Head congestion, coughing, sneezing, and headache started saturday. Fever unknown. )     URI   This is a new problem. The current episode started in the past 7 days. The problem has been unchanged. There has been no fever. Associated symptoms include congestion, coughing, headaches, nausea, rhinorrhea, sinus pain and sneezing. Pertinent negatives include no abdominal pain, chest pain, ear pain, sore throat, vomiting or wheezing. She has tried decongestant and NSAIDs (flonase) for the symptoms. The treatment provided mild relief.   Dizziness - feels like the room is spinning, comes and goes and is worse with sitting  No hx of vertigo Drinking lots of fluids  Review of Systems  HENT: Positive for congestion, postnasal drip, rhinorrhea, sinus pressure, sinus pain and sneezing. Negative for ear pain and sore throat.   Respiratory: Positive for cough. Negative for shortness of breath and wheezing.   Cardiovascular: Negative for chest pain.  Gastrointestinal: Positive for nausea. Negative for abdominal pain and vomiting.  Neurological: Positive for dizziness and headaches.     Social History   Tobacco Use  Smoking Status Never Smoker  Smokeless Tobacco Never Used        Objective:    BP Readings from Last 3 Encounters:  05/15/18 140/76  04/10/18 130/80  10/25/16 124/78   Wt Readings from Last 3 Encounters:  05/15/18 (!) 316 lb 4 oz (143.5 kg)  04/10/18 (!) 317 lb (143.8 kg)  10/25/16 295 lb 12.8 oz (134.2 kg)    BP 140/76   Pulse 88   Temp 98 F (36.7 C) (Oral)   Ht 5\' 6"  (1.676 m)   Wt (!) 316 lb 4 oz (143.5 kg)   LMP 04/28/2018   SpO2 98%   BMI 51.04 kg/m    Physical Exam Constitutional:      General: She is not in acute distress.    Appearance: She is well-developed. She is not diaphoretic.  HENT:     Head:  Normocephalic and atraumatic.     Right Ear: Tympanic membrane and ear canal normal. There is impacted cerumen.     Left Ear: Ear canal normal. A middle ear effusion is present. Tympanic membrane is not erythematous, retracted or bulging.     Nose: Mucosal edema and rhinorrhea present.     Right Sinus: No maxillary sinus tenderness or frontal sinus tenderness.     Left Sinus: No maxillary sinus tenderness or frontal sinus tenderness.     Mouth/Throat:     Pharynx: Uvula midline. Posterior oropharyngeal erythema present. No oropharyngeal exudate.     Tonsils: Swelling: 0 on the right. 0 on the left.  Eyes:     General: No scleral icterus.    Extraocular Movements:     Right eye: No nystagmus.     Left eye: No nystagmus.     Conjunctiva/sclera: Conjunctivae normal.  Neck:     Musculoskeletal: Neck supple.  Cardiovascular:     Rate and Rhythm: Normal rate and regular rhythm.     Heart sounds: Normal heart sounds. No murmur.  Pulmonary:     Effort: Pulmonary effort is normal. No respiratory distress.     Breath sounds: Normal breath sounds.  Lymphadenopathy:     Cervical: No cervical adenopathy.  Skin:    General: Skin is warm and dry.     Capillary Refill: Capillary refill  takes less than 2 seconds.  Neurological:     Mental Status: She is alert.           Assessment & Plan:   Problem List Items Addressed This Visit    None    Visit Diagnoses    Viral URI    -  Primary   Dizziness         Suspect viral uri with some dizziness likely due to orthostatic symptoms. Endorses room spinning, however, symptoms worse with standing and sitting and improved with laying down.   URI symptoms improving - continued symptomatic care Increase hydration Return if dizziness persisting  Return if symptoms worsen or fail to improve.  Lynnda Child, MD

## 2018-05-15 NOTE — Patient Instructions (Signed)
Based on your symptoms, it looks like you have a virus.   Antibiotics are not need for a viral infection but the following will help:   1. Drink plenty of fluids 2. Get lots of rest  Sinus Congestion 1) Neti Pot (Saline rinse) -- 2 times day -- if tolerated 2) Flonase (Store Brand ok) - once daily 3) Over the counter congestion medications  Cough 1) Cough drops can be helpful 2) Nyquil (or nighttime cough medication) 3) Honey is proven to be one of the best cough medications   Dizziness 1) be careful with transitions 2) Drink at least 64 oz of water per day -- consider an electrolyte drink 3) if worsening or not improving in the next 3-5 days, return to clinic   If you develop fevers (Temperature >100.4), chills, worsening symptoms or symptoms lasting longer than 10 days return to clinic.

## 2018-10-10 ENCOUNTER — Encounter: Payer: Self-pay | Admitting: Family Medicine

## 2018-10-10 ENCOUNTER — Other Ambulatory Visit: Payer: Self-pay

## 2018-10-10 ENCOUNTER — Ambulatory Visit: Payer: BC Managed Care – PPO | Admitting: Family Medicine

## 2018-10-10 DIAGNOSIS — R2231 Localized swelling, mass and lump, right upper limb: Secondary | ICD-10-CM | POA: Insufficient documentation

## 2018-10-10 NOTE — Progress Notes (Signed)
Subjective:    Patient ID: Tracey Cunningham, female    DOB: 1980-08-16, 38 y.o.   MRN: [id number]  HPI Here for a knot on her R hand    Wt Readings from Last 3 Encounters:  10/10/18 (!) 326 lb 9 oz (148.1 kg)  05/15/18 (!) 316 lb 4 oz (143.5 kg)  04/10/18 (!) 317 lb (143.8 kg)   52.71 kg/m   Knot appeared middle finger R hand  Just below the 3rd MCP joint Not painful No trauma known Is R handed   No redness or warmth  Noticed when she scratched it   No bug bites  Not outdoors much No tick bites   No hx of hand surgery  No hx of cyst   Grip is ok   Her joints ache from typing some times    Patient Active Problem List   Diagnosis Date Noted  . Lump on finger, right 10/10/2018  . Family history of breast cancer in mother 02/02/2016  . Globus sensation 11/05/2014  . GERD (gastroesophageal reflux disease) 11/05/2014  . Routine gynecological examination 01/31/2012  . Routine general medical examination at a health care facility 01/31/2012  . Right sided abdominal pain 10/06/2010  . IRRITABLE BOWEL SYNDROME 04/23/2008  . Morbid obesity (HCC) 04/19/2007  . ANEMIA-IRON DEFICIENCY 08/24/2006  . Essential hypertension 08/24/2006  . MENORRHALGIA 08/24/2006   Past Medical History:  Diagnosis Date  . Anemia    iron deficiency   . Cerumen impaction   . Hypertension    borderline  . Irregular periods    [facility]   . Obesity    weight loss with diet and exercise    Past Surgical History:  Procedure Laterality Date  . APPENDECTOMY  10/2005  . foot fracture     right foot    Social History   Tobacco Use  . Smoking status: Never Smoker  . Smokeless tobacco: Never Used  Substance Use Topics  . Alcohol use: Yes    Alcohol/week: 0.0 standard drinks    Comment: wine- occ  . Drug use: No   Family History  Problem Relation Age of Onset  . Hypertension Maternal Grandfather   . Heart attack Maternal Grandfather   . Breast cancer Mother 257  . Breast  cancer Maternal Grandmother        70's  . Breast cancer Paternal Grandmother        3870's   No Known Allergies Current Outpatient Medications on File Prior to Visit  Medication Sig Dispense Refill  . famotidine (PEPCID) 20 MG tablet Take 20 mg by mouth as needed.      No current facility-administered medications on file prior to visit.     Review of Systems  Constitutional: Negative for chills, diaphoresis, fatigue and fever.  Respiratory: Negative for cough and chest tightness.   Cardiovascular: Negative for chest pain.  Musculoskeletal: Negative for arthralgias, back pain and joint swelling.  Skin: Negative for color change, rash and wound.  Allergic/Immunologic: Negative for immunocompromised state.       Objective:   Physical Exam Constitutional:      General: She is not in acute distress.    Appearance: Normal appearance. She is obese. She is not ill-appearing.  HENT:     Head: Normocephalic and atraumatic.  Eyes:     Extraocular Movements: Extraocular movements intact.     Conjunctiva/sclera: Conjunctivae normal.     Pupils: Pupils are equal, round, and reactive to light.  Neck:  Musculoskeletal: Normal range of motion. No muscular tenderness.  Cardiovascular:     Rate and Rhythm: Regular rhythm.  Pulmonary:     Effort: Pulmonary effort is normal. No respiratory distress.  Musculoskeletal:     Comments: 34mm round slightly mobile rubbery mass felt over dorsal proximal 3rd phalanx  Nl rom of hand and finger No redness/warmth/drainage nontender  No joint tenderness or swelling   Lymphadenopathy:     Cervical: No cervical adenopathy.  Skin:    General: Skin is warm and dry.     Findings: No erythema or rash.  Neurological:     Mental Status: She is alert.     Sensory: No sensory deficit.     Motor: No weakness or atrophy.     Coordination: Coordination normal.  Psychiatric:        Mood and Affect: Mood normal.           Assessment & Plan:    Problem List Items Addressed This Visit      Other   Lump on finger, right    Small round mass palpable on dorsum of R 3rd digit Suspect ganglion cyst  Not tender  Pt is interested in definitive diagnosis and treatment  Ref to hand specialist  inst to update if it turns red/painful or rapidly inc in size      Relevant Orders   Ambulatory referral to Orthopedic Surgery

## 2018-10-10 NOTE — Patient Instructions (Signed)
The office will call you to arrange a hand specialist appointment   In think you may have a ganglion cyst  If painful or redness or swelling -let us know   If grip or sensation are affected also let us know

## 2018-10-10 NOTE — Assessment & Plan Note (Signed)
Small round mass palpable on dorsum of R 3rd digit Suspect ganglion cyst  Not tender  Pt is interested in definitive diagnosis and treatment  Ref to hand specialist  inst to update if it turns red/painful or rapidly inc in size

## 2018-10-30 DIAGNOSIS — M79641 Pain in right hand: Secondary | ICD-10-CM | POA: Diagnosis not present

## 2018-12-24 ENCOUNTER — Encounter: Payer: Self-pay | Admitting: Family Medicine

## 2018-12-24 ENCOUNTER — Other Ambulatory Visit: Payer: Self-pay

## 2018-12-24 ENCOUNTER — Ambulatory Visit (INDEPENDENT_AMBULATORY_CARE_PROVIDER_SITE_OTHER): Payer: BC Managed Care – PPO | Admitting: Family Medicine

## 2018-12-24 VITALS — BP 130/80 | HR 91 | Temp 97.8°F | Ht 65.5 in | Wt 326.4 lb

## 2018-12-24 DIAGNOSIS — K219 Gastro-esophageal reflux disease without esophagitis: Secondary | ICD-10-CM | POA: Diagnosis not present

## 2018-12-24 DIAGNOSIS — Z23 Encounter for immunization: Secondary | ICD-10-CM

## 2018-12-24 DIAGNOSIS — N946 Dysmenorrhea, unspecified: Secondary | ICD-10-CM

## 2018-12-24 DIAGNOSIS — I10 Essential (primary) hypertension: Secondary | ICD-10-CM

## 2018-12-24 DIAGNOSIS — K582 Mixed irritable bowel syndrome: Secondary | ICD-10-CM

## 2018-12-24 DIAGNOSIS — Z803 Family history of malignant neoplasm of breast: Secondary | ICD-10-CM

## 2018-12-24 DIAGNOSIS — Z0000 Encounter for general adult medical examination without abnormal findings: Secondary | ICD-10-CM | POA: Diagnosis not present

## 2018-12-24 NOTE — Assessment & Plan Note (Signed)
Discussed how this problem influences overall health and the risks it imposes  Reviewed plan for weight loss with lower calorie diet (via better food choices and also portion control or program like weight watchers) and exercise building up to or more than 30 minutes 5 days per week including some aerobic activity   Disc options for exercise that would not hurt her back like exercise bike or chair exercise program

## 2018-12-24 NOTE — Assessment & Plan Note (Signed)
This may be the cause of her occ LLQ discomfort/cramping Suggested trial of citurcel  Also inc fluid intake Also probiotic May consider anti spasmotic agent in the future  Colonoscopy if symptoms worsen or persist

## 2018-12-24 NOTE — Patient Instructions (Addendum)
You may want to consider an exercise bike  Also some modified chair exercise programs  Try to build up to 30 minutes or more per day   Flu shot today  Tdap today   The probiotic Align over the counter is helpful for IBS So is fiber - a daily dose of citrucel may help also   Think about follow up with your gyn - for period issues  Continue to do self breast exams   Labs today

## 2018-12-24 NOTE — Assessment & Plan Note (Signed)
Reviewed health habits including diet and exercise and skin cancer prevention Reviewed appropriate screening tests for age  Also reviewed health mt list, fam hx and immunization status , as well as social and family history   See HPI Labs ordered  BP improved on 2nd check  Nl breast exam , enc self exams  Flu shot given today  Tdap vaccine today Gyn care per gyn provider Strongly encouraged wt loss with healthy diet and exercise

## 2018-12-24 NOTE — Progress Notes (Signed)
Subjective:    Patient ID: Tracey Cunningham, female    DOB: [birth year], 38 y.o.   MRN: [id number]  HPI Here for health maintenance exam and to review chronic medical problems  Had to cancel trips due to the pandemic  Family has been covid free so far    Wt Readings from Last 3 Encounters:  12/24/18 (!) 326 lb 6 oz (148 kg)  10/10/18 (!) 326 lb 9 oz (148.1 kg)  05/15/18 (!) 316 lb 4 oz (143.5 kg)  no change  Eating healthy  Not a lot of exercise -would be interested but it hurts to stand on her feet for long periods of time (5 minutes) 53.49 kg/m   Td 9/09= due for Tdap Also wants a flu shot today    Pap 2/18 at [facility]  Neg with neg HPV screen  Menses - pretty normal for her/heavy bleeding  Was put on lysteda for heavy menses in the past  (lysine analog/hemostatic agent) She does not remember if this helped  Also discussed IUD  Not sexually active   OCs increase her BP    Has occ crampy pain in Left lower abdomen  Lasts several days Ibuprofen helps  Could be IBS related Constipation and diarrhea  No blood in her stool  Has never had a colonoscopy  She tried fiber - ? How she did  Has not tried probiotic   Self breast exam  She had a mammogram 1/18 that was normal Mother and MGM had breast cancer     bp is stable today  No cp or palpitations or headaches or edema  No side effects to medicines  BP Readings from Last 3 Encounters:  12/24/18 (!) 142/86  10/10/18 132/86  05/15/18 140/76    Has not checked at home  BP: 130/80  Improved on 2nd check after sitting   GERD  Takes pepcid 20 mg daily prn-usually 4-5 times per week  Has to watch out for pizza and spicy food    Due for wellness labs  In 2018 glucose was 103   Patient Active Problem List   Diagnosis Date Noted   Lump on finger, right 10/10/2018   Family history of breast cancer in mother 02/02/2016   GERD (gastroesophageal reflux disease) 11/05/2014   Routine gynecological  examination 01/31/2012   Routine general medical examination at a health care facility 01/31/2012   IRRITABLE BOWEL SYNDROME 04/23/2008   Morbid obesity (HCC) 04/19/2007   ANEMIA-IRON DEFICIENCY 08/24/2006   Essential hypertension 08/24/2006   MENORRHALGIA 08/24/2006   Past Medical History:  Diagnosis Date   Anemia    iron deficiency    Cerumen impaction    Hypertension    borderline   Irregular periods    [facility]    Obesity    weight loss with diet and exercise    Past Surgical History:  Procedure Laterality Date   APPENDECTOMY  10/2005   foot fracture     right foot    Social History   Tobacco Use   Smoking status: Never Smoker   Smokeless tobacco: Never Used  Substance Use Topics   Alcohol use: Yes    Alcohol/week: 0.0 standard drinks    Comment: wine- occ   Drug use: No   Family History  Problem Relation Age of Onset   Hypertension Maternal Grandfather    Heart attack Maternal Grandfather    Breast cancer Mother 38   Breast cancer Maternal Grandmother  70's   Breast cancer Paternal Grandmother        70's   No Known Allergies Current Outpatient Medications on File Prior to Visit  Medication Sig Dispense Refill   famotidine (PEPCID) 20 MG tablet Take 20 mg by mouth as needed.      No current facility-administered medications on file prior to visit.     Review of Systems  Constitutional: Negative for activity change, appetite change, fatigue, fever and unexpected weight change.  HENT: Negative for congestion, ear pain, rhinorrhea, sinus pressure and sore throat.   Eyes: Negative for pain, redness and visual disturbance.  Respiratory: Negative for cough, shortness of breath and wheezing.   Cardiovascular: Negative for chest pain and palpitations.  Gastrointestinal: Positive for abdominal pain, constipation and diarrhea. Negative for anal bleeding, blood in stool, nausea, rectal pain and vomiting.  Endocrine: Negative  for polydipsia and polyuria.  Genitourinary: Positive for menstrual problem. Negative for dysuria, frequency and urgency.  Musculoskeletal: Negative for arthralgias, back pain and myalgias.  Skin: Negative for pallor and rash.  Allergic/Immunologic: Negative for environmental allergies.  Neurological: Negative for dizziness, syncope and headaches.  Hematological: Negative for adenopathy. Does not bruise/bleed easily.  Psychiatric/Behavioral: Negative for decreased concentration and dysphoric mood. The patient is not nervous/anxious.        Objective:   Physical Exam Constitutional:      General: She is not in acute distress.    Appearance: Normal appearance. She is well-developed. She is obese. She is not ill-appearing or diaphoretic.  HENT:     Head: Normocephalic and atraumatic.     Right Ear: Tympanic membrane, ear canal and external ear normal.     Left Ear: Tympanic membrane, ear canal and external ear normal.     Nose: Nose normal. No congestion.     Mouth/Throat:     Mouth: Mucous membranes are moist.     Pharynx: Oropharynx is clear. No posterior oropharyngeal erythema.  Eyes:     General: No scleral icterus.    Extraocular Movements: Extraocular movements intact.     Conjunctiva/sclera: Conjunctivae normal.     Pupils: Pupils are equal, round, and reactive to light.  Neck:     Musculoskeletal: Normal range of motion and neck supple. No neck rigidity or muscular tenderness.     Thyroid: No thyromegaly.     Vascular: No carotid bruit or JVD.  Cardiovascular:     Rate and Rhythm: Normal rate and regular rhythm.     Pulses: Normal pulses.     Heart sounds: Normal heart sounds. No gallop.   Pulmonary:     Effort: Pulmonary effort is normal. No respiratory distress.     Breath sounds: Normal breath sounds. No wheezing.     Comments: Good air exch Chest:     Chest wall: No tenderness.  Abdominal:     General: Bowel sounds are normal. There is no distension or abdominal  bruit.     Palpations: Abdomen is soft. There is no mass.     Tenderness: There is no abdominal tenderness.     Hernia: No hernia is present.  Genitourinary:    Comments: Breast exam: No mass, nodules, thickening, tenderness, bulging, retraction, inflamation, nipple discharge or skin changes noted.  No axillary or clavicular LA.     Dense breasts consistent with age  Musculoskeletal: Normal range of motion.        General: No tenderness.     Right lower leg: No edema.  Left lower leg: No edema.  Lymphadenopathy:     Cervical: No cervical adenopathy.  Skin:    General: Skin is warm and dry.     Coloration: Skin is not pale.     Findings: No erythema or rash.  Neurological:     Mental Status: She is alert. Mental status is at baseline.     Cranial Nerves: No cranial nerve deficit.     Motor: No abnormal muscle tone.     Coordination: Coordination normal.     Gait: Gait normal.     Deep Tendon Reflexes: Reflexes are normal and symmetric. Reflexes normal.  Psychiatric:        Mood and Affect: Mood normal.     Comments: Pleasant            Assessment & Plan:   Problem List Items Addressed This Visit      Cardiovascular and Mediastinum   Essential hypertension    bp in fair control at this time  BP Readings from Last 1 Encounters:  12/24/18 130/80  no longer taking hctz (bp was higher in the past on OC) No medication needed currently  Labs ordered  Disc lifstyle change with low sodium diet and exercise  Wt loss was strongly enc as well       Relevant Orders   CBC with Differential/Platelet   Comprehensive metabolic panel   Lipid panel   TSH     Digestive   IRRITABLE BOWEL SYNDROME    This may be the cause of her occ LLQ discomfort/cramping Suggested trial of citurcel  Also inc fluid intake Also probiotic May consider anti spasmotic agent in the future  Colonoscopy if symptoms worsen or persist      GERD (gastroesophageal reflux disease)    Controlled  by prn pepcid along with avoidance of offending foods         Genitourinary   MENORRHALGIA    Pt does not remember the last medication given by gyn  Obesity may play a role Cannot take OC due to bp  Has considered IUD Enc she f/u with gyn         Other   Morbid obesity ([place])    Discussed how this problem influences overall health and the risks it imposes  Reviewed plan for weight loss with lower calorie diet (via better food choices and also portion control or program like weight watchers) and exercise building up to or more than 30 minutes 5 days per week including some aerobic activity   Disc options for exercise that would not hurt her back like exercise bike or chair exercise program      Routine general medical examination at a health care facility - Primary    Reviewed health habits including diet and exercise and skin cancer prevention Reviewed appropriate screening tests for age  Also reviewed health mt list, fam hx and immunization status , as well as social and family history   See HPI Labs ordered  BP improved on 2nd check  Nl breast exam , enc self exams  Flu shot given today  Tdap vaccine today Gyn care per gyn provider Strongly encouraged wt loss with healthy diet and exercise          Family history of breast cancer in mother    Pt did have nl mammogram in 2018  Mother and MGM had breast cancer (mother in her 50s) Suggest starting yearly mammograms at 60 or whenever her gyn suggests Continue self exams  Other Visit Diagnoses    Need for influenza vaccination       Relevant Orders   Flu Vaccine QUAD 6+ mos PF IM (Fluarix Quad PF) (Completed)   Need for Tdap vaccination       Relevant Orders   Tdap vaccine greater than or equal to 7yo IM (Completed)

## 2018-12-24 NOTE — Assessment & Plan Note (Signed)
bp in fair control at this time  BP Readings from Last 1 Encounters:  12/24/18 130/80  no longer taking hctz (bp was higher in the past on OC) No medication needed currently  Labs ordered  Disc lifstyle change with low sodium diet and exercise  Wt loss was strongly enc as well

## 2018-12-24 NOTE — Assessment & Plan Note (Signed)
Controlled by prn pepcid along with avoidance of offending foods

## 2018-12-24 NOTE — Assessment & Plan Note (Signed)
Pt did have nl mammogram in 2018  Mother and MGM had breast cancer (mother in her 23s) Suggest starting yearly mammograms at 4 or whenever her gyn suggests Continue self exams

## 2018-12-24 NOTE — Assessment & Plan Note (Signed)
Pt does not remember the last medication given by gyn  Obesity may play a role Cannot take OC due to bp  Has considered IUD Enc she f/u with gyn

## 2018-12-25 LAB — CBC WITH DIFFERENTIAL/PLATELET
Basophils Absolute: 0.1 10*3/uL (ref 0.0–0.1)
Basophils Relative: 0.8 % (ref 0.0–3.0)
Eosinophils Absolute: 0.1 10*3/uL (ref 0.0–0.7)
Eosinophils Relative: 1.6 % (ref 0.0–5.0)
HCT: 36.3 % (ref 36.0–46.0)
Hemoglobin: 11.6 g/dL — ABNORMAL LOW (ref 12.0–15.0)
Lymphocytes Relative: 24.3 % (ref 12.0–46.0)
Lymphs Abs: 2.2 10*3/uL (ref 0.7–4.0)
MCHC: 31.9 g/dL (ref 30.0–36.0)
MCV: 77 fl — ABNORMAL LOW (ref 78.0–100.0)
Monocytes Absolute: 0.6 10*3/uL (ref 0.1–1.0)
Monocytes Relative: 6.1 % (ref 3.0–12.0)
Neutro Abs: 6.2 10*3/uL (ref 1.4–7.7)
Neutrophils Relative %: 67.2 % (ref 43.0–77.0)
Platelets: 290 10*3/uL (ref 150.0–400.0)
RBC: 4.71 Mil/uL (ref 3.87–5.11)
RDW: 17.3 % — ABNORMAL HIGH (ref 11.5–15.5)
WBC: 9.2 10*3/uL (ref 4.0–10.5)

## 2018-12-25 LAB — COMPREHENSIVE METABOLIC PANEL
ALT: 76 U/L — ABNORMAL HIGH (ref 0–35)
AST: 41 U/L — ABNORMAL HIGH (ref 0–37)
Albumin: 4.3 g/dL (ref 3.5–5.2)
Alkaline Phosphatase: 82 U/L (ref 39–117)
BUN: 13 mg/dL (ref 6–23)
CO2: 24 mEq/L (ref 19–32)
Calcium: 9.1 mg/dL (ref 8.4–10.5)
Chloride: 107 mEq/L (ref 96–112)
Creatinine, Ser: 0.73 mg/dL (ref 0.40–1.20)
GFR: 89.11 mL/min (ref >60.00)
Glucose, Bld: 91 mg/dL (ref 70–99)
Potassium: 4.4 mEq/L (ref 3.5–5.1)
Sodium: 140 mEq/L (ref 135–145)
Total Bilirubin: 0.4 mg/dL (ref 0.2–1.2)
Total Protein: 7.2 g/dL (ref 6.0–8.3)

## 2018-12-25 LAB — TSH: TSH: 3.16 u[IU]/mL (ref 0.35–4.50)

## 2018-12-25 LAB — LIPID PANEL
Cholesterol: 145 mg/dL (ref 0–200)
HDL: 43.2 mg/dL (ref >39.00)
LDL Cholesterol: 70 mg/dL (ref 0–99)
NonHDL: 101.56
Total CHOL/HDL Ratio: 3
Triglycerides: 156 mg/dL — ABNORMAL HIGH (ref 0.0–149.0)
VLDL: 31.2 mg/dL (ref 0.0–40.0)

## 2018-12-26 ENCOUNTER — Telehealth: Payer: Self-pay | Admitting: *Deleted

## 2018-12-26 NOTE — Telephone Encounter (Signed)
Called pt regarding lab results and no answer and pt's VM box is full so couldn't leave a message

## 2019-01-01 ENCOUNTER — Telehealth: Payer: Self-pay | Admitting: Family Medicine

## 2019-01-01 DIAGNOSIS — R7401 Elevation of levels of liver transaminase levels: Secondary | ICD-10-CM | POA: Insufficient documentation

## 2019-01-01 NOTE — Telephone Encounter (Signed)
-----   Message from Tammi Sou, [place] sent at 01/01/2019 12:19 PM EDT ----- Pt notified of Dr. Marliss Coots comments and recommendations. Pt said she is okay with getting [place] of liver. I advise pt our [facility] will call to schedule [place], please put order in

## 2019-01-01 NOTE — Telephone Encounter (Signed)
Sent to [facility]

## 2019-01-02 NOTE — Telephone Encounter (Signed)
Addressed in result notes  

## 2019-01-08 ENCOUNTER — Ambulatory Visit
Admission: RE | Admit: 2019-01-08 | Discharge: 2019-01-08 | Disposition: A | Discharge status: Home / Self Care | Payer: BC Managed Care – PPO | Source: Ambulatory Visit | Attending: Family Medicine | Admitting: Family Medicine

## 2019-01-08 DIAGNOSIS — R7401 Elevation of levels of liver transaminase levels: Secondary | ICD-10-CM

## 2019-01-08 DIAGNOSIS — R7989 Other specified abnormal findings of blood chemistry: Secondary | ICD-10-CM | POA: Diagnosis not present

## 2019-01-09 ENCOUNTER — Telehealth: Payer: Self-pay | Admitting: Family Medicine

## 2019-01-09 DIAGNOSIS — R7401 Elevation of levels of liver transaminase levels: Secondary | ICD-10-CM

## 2019-01-09 NOTE — Telephone Encounter (Signed)
-----   Message from Alric Quan, [place] sent at 01/09/2019  9:38 AM EDT ----- Notified patient of these results. Patient agreed to come in for non-fasting labs on Monday, 11/2. Dr. Glori Bickers, would you mind putting the labs in that you need? Or you can let me know what you need and I will be more than happy to put them in! Thanks!

## 2019-01-09 NOTE — Telephone Encounter (Signed)
Orders are in   Thanks

## 2019-01-13 ENCOUNTER — Telehealth: Payer: Self-pay | Admitting: Family Medicine

## 2019-01-13 NOTE — Telephone Encounter (Signed)
-----   Message from Cloyd Stagers, RT sent at 01/09/2019  1:12 PM EDT ----- Regarding: Lab Orders for Monday 11.2.2020 Please place lab orders for Monday 11.2.2020, appt notes state "follow up labs" Thank you, Dyke Maes RT(R)

## 2019-01-13 NOTE — Telephone Encounter (Signed)
-----   Message from Ellamae Sia sent at 01/09/2019 11:08 AM EDT ----- Regarding: Lab orders for Monday, 11.2.20 Lab orders for f/u

## 2019-01-14 ENCOUNTER — Other Ambulatory Visit: Payer: BC Managed Care – PPO

## 2019-01-14 ENCOUNTER — Other Ambulatory Visit (INDEPENDENT_AMBULATORY_CARE_PROVIDER_SITE_OTHER): Payer: BC Managed Care – PPO

## 2019-01-14 DIAGNOSIS — R7401 Elevation of levels of liver transaminase levels: Secondary | ICD-10-CM

## 2019-01-15 LAB — IBC PANEL
Iron: 39 ug/dL — ABNORMAL LOW (ref 42–145)
Saturation Ratios: 9.9 % — ABNORMAL LOW (ref 20.0–50.0)
Transferrin: 280 mg/dL (ref 212.0–360.0)

## 2019-01-15 LAB — HEPATIC FUNCTION PANEL
ALT: 123 U/L — ABNORMAL HIGH (ref 0–35)
AST: 65 U/L — ABNORMAL HIGH (ref 0–37)
Albumin: 4.3 g/dL (ref 3.5–5.2)
Alkaline Phosphatase: 80 U/L (ref 39–117)
Bilirubin, Direct: 0 mg/dL (ref 0.0–0.3)
Total Bilirubin: 0.5 mg/dL (ref 0.2–1.2)
Total Protein: 7.3 g/dL (ref 6.0–8.3)

## 2019-01-17 ENCOUNTER — Telehealth: Payer: Self-pay | Admitting: Family Medicine

## 2019-01-17 LAB — ACUTE HEP PANEL AND HEP B SURFACE AB
HEPATITIS C ANTIBODY REFILL$(REFL): NONREACTIVE
Hep A IgM: NONREACTIVE
Hep B C IgM: NONREACTIVE
Hepatitis B Surface Ag: NONREACTIVE
SIGNAL TO CUT-OFF: 0.01 (ref <1.00)

## 2019-01-17 LAB — ANA: Anti Nuclear Antibody (ANA): NEGATIVE

## 2019-01-17 LAB — ANTI-SMOOTH MUSCLE ANTIBODY, IGG: Actin (Smooth Muscle) Antibody (IGG): <20 U (ref <20)

## 2019-01-17 LAB — CERULOPLASMIN: Ceruloplasmin: 39 mg/dL (ref 18–53)

## 2019-01-17 LAB — REFLEX TIQ

## 2019-01-17 NOTE — Telephone Encounter (Signed)
Pt.notified

## 2019-01-17 NOTE — Telephone Encounter (Signed)
Not all are back yet  Waiting on all to release/comment

## 2019-01-17 NOTE — Telephone Encounter (Signed)
Patient is calling in regards to her lab results. She would like to know if these have come back   Requesting a call back

## 2019-01-18 ENCOUNTER — Telehealth: Payer: Self-pay | Admitting: Family Medicine

## 2019-01-18 DIAGNOSIS — R7401 Elevation of levels of liver transaminase levels: Secondary | ICD-10-CM

## 2019-01-18 NOTE — Telephone Encounter (Signed)
I spoke to pt We reviewed her [place] and lab results No etoh and no acetaminophen  Unsure what is causing her mild ast and alt elevation   She agrees with a GI referral  I will place it and send to the [facility]

## 2019-01-18 NOTE — Telephone Encounter (Signed)
-----   Message from Tammi Sou, [place] sent at 01/18/2019  4:38 PM EST ----- Pt viewed the lab results and she would like to speak with Dr. Glori Bickers directly regarding labs before she makes a decision regarding if she wants to see GI doc, please call pt at [phone]

## 2019-01-21 NOTE — Telephone Encounter (Signed)
Called patient and she wants to go to [facility]. Sent Referral over to [facility], also gave patient detailed info where they are located and phone number.

## 2019-02-28 ENCOUNTER — Encounter: Payer: Self-pay | Admitting: Family Medicine

## 2019-03-21 ENCOUNTER — Ambulatory Visit: Payer: BC Managed Care – PPO | Admitting: Gastroenterology

## 2019-03-21 ENCOUNTER — Encounter: Payer: Self-pay | Admitting: Gastroenterology

## 2019-03-21 ENCOUNTER — Other Ambulatory Visit: Payer: Self-pay

## 2019-03-21 VITALS — BP 138/82 | HR 118 | Temp 98.3°F | Ht 65.5 in | Wt 327.2 lb

## 2019-03-21 DIAGNOSIS — R7401 Elevation of levels of liver transaminase levels: Secondary | ICD-10-CM | POA: Diagnosis not present

## 2019-03-21 NOTE — Progress Notes (Addendum)
Gastroenterology Consultation  Referring Provider:     Judy Pimple, MD Primary Care Physician:  Tower, Audrie Gallus, MD Primary Gastroenterologist:  Dr. Servando Snare     Reason for Consultation:     Abnormal liver enzymes        HPI:   Tracey Cunningham is a 39 y.o. y/o female referred for consultation & management of abnormal liver enzymes by Dr. Milinda Antis, Audrie Gallus, MD.  This patient comes in today with a history of abnormal liver enzymes.  The patient has had abnormal liver enzymes documented as far back as 2018 with the labs showing:  Component     Latest Ref Rng & Units 10/25/2016 12/24/2018 01/14/2019  Total Bilirubin     0.2 - 1.2 mg/dL 0.5 0.4 0.5  Bilirubin, Direct     0.0 - 0.3 mg/dL   0.0  Alkaline Phosphatase     39 - 117 U/L 75 82 80  AST     0 - 37 U/L 22 41 (H) 65 (H)  ALT     0 - 35 U/L 36 (H) 76 (H) 123 (H)   She also had a ultrasound of the liver that showed:  IMPRESSION: 1. No ultrasound abnormality of the liver to explain elevated LFTs. Normal echogenicity of the parenchyma. 2.  Status post cholecystectomy.  The patient's acute hepatitis panel, ANA, SMA and ceruloplasmin were all normal.  She was noted to have high triglycerides and low iron with a slightly low hemoglobin.  The patient has also contacted her primary care physician with reports of left lower quadrant pain back on 17 December that she had noted to be different than her IBS pain since she was not having diarrhea.  The patient also carries a diagnosis of irregular periods and has been followed by [facility]. The patient's left-sided abdominal pain is not associated with eating drinking or change in bowel habits.  She states that it can grab her even in the middle of the night and wake her up from sleep.  There is no report of any black stools or bloody stools.  She also denies any nausea vomiting.  The patient states that when she takes ibuprofen and uses a heating pad to the left lower quadrant it helps the  pain.  Past Medical History:  Diagnosis Date  . Anemia    iron deficiency   . Cerumen impaction   . Hypertension    borderline  . Irregular periods    [facility]- [facility]   . Obesity    weight loss with diet and exercise     Past Surgical History:  Procedure Laterality Date  . APPENDECTOMY  10/2005  . foot fracture     right foot     Prior to Admission medications   Medication Sig Start Date End Date Taking? Authorizing Provider  famotidine (PEPCID) 20 MG tablet Take 20 mg by mouth as needed.     [provider]    Family History  Problem Relation Age of Onset  . Hypertension Maternal Grandfather   . Heart attack Maternal Grandfather   . Breast cancer Mother 50  . Breast cancer Maternal Grandmother        70's  . Breast cancer Paternal Grandmother        16's     Social History   Tobacco Use  . Smoking status: Never Smoker  . Smokeless tobacco: Never Used  Substance Use Topics  . Alcohol use: Yes  Alcohol/week: 0.0 standard drinks    Comment: wine- occ  . Drug use: No    Allergies as of 03/21/2019  . (No Known Allergies)    Review of Systems:    All systems reviewed and negative except where noted in HPI.   Physical Exam:  There were no vitals taken for this visit. No LMP recorded. General:   Alert,  Well-developed, well-nourished, pleasant and cooperative in NAD Head:  Normocephalic and atraumatic. Eyes:  Sclera clear, no icterus.   Conjunctiva pink. Ears:  Normal auditory acuity. Neck:  Supple; no masses or thyromegaly. Lungs:  Respirations even and unlabored.  Clear throughout to auscultation.   No wheezes, crackles, or rhonchi. No acute distress. Heart:  Regular rate and rhythm; no murmurs, clicks, rubs, or gallops. Abdomen:  Normal bowel sounds.  No bruits.  Soft, positive tenderness to 1 finger palpation while flexing the abdominal wall muscles and non-distended without masses, hepatosplenomegaly or hernias noted.  No guarding or rebound  tenderness.  Positive Carnett sign.   Rectal:  Deferred.  Pulses:  Normal pulses noted. Extremities:  No clubbing or edema.  No cyanosis. Neurologic:  Alert and oriented x3;  grossly normal neurologically. Skin:  Intact without significant lesions or rashes.  No jaundice. Lymph Nodes:  No significant cervical adenopathy. Psych:  Alert and cooperative. Normal mood and affect.  Imaging Studies: No results found.  Assessment and Plan:   Tracey Cunningham is a 39 y.o. y/o female who comes in with left lower quadrant pain that is consistent with musculoskeletal pain.  The patient has been explained that the pain is musculoskeletal and she was able to reproduce it while palpating it herself and flexing the abdominal wall muscles.  The patient's abnormal liver enzymes may be due to fatty liver although was not seen on ultrasound in light of the patient having a BMI of 53.6.  The patient may also have abnormal liver enzymes due to the ibuprofen she is taking.  The patient has been told to try and lose 15% of her body weight to decrease the amount of fat in her liver.  She has been informed about the Mediterranean diet.  She is also been told to try and avoid ibuprofen especially if we are going to repeat the liver enzymes in 3 months to see if the ibuprofen may be affecting her liver enzymes.  The patient will follow up in 3 months for repeat evaluation.  She will also have her lab sent off for alpha-1 antitrypsin and antibodies to hepatitis A and B.  She will be vaccinated accordingly.  We will also recheck the liver enzymes today.  The patient has been explained the plan and agrees with it.   Lucilla Lame, MD. Marval Regal    Note: This dictation was prepared with Dragon dictation along with smaller phrase technology. Any transcriptional errors that result from this process are unintentional.

## 2019-03-22 DIAGNOSIS — R7401 Elevation of levels of liver transaminase levels: Secondary | ICD-10-CM | POA: Diagnosis not present

## 2019-03-23 LAB — HEPATITIS A ANTIBODY, TOTAL: hep A Total Ab: NEGATIVE

## 2019-03-23 LAB — ALPHA-1-ANTITRYPSIN: A-1 Antitrypsin: 157 mg/dL (ref 100–188)

## 2019-03-23 LAB — HEPATIC FUNCTION PANEL
ALT: 138 IU/L — ABNORMAL HIGH (ref 0–32)
AST: 74 IU/L — ABNORMAL HIGH (ref 0–40)
Albumin: 4.3 g/dL (ref 3.8–4.8)
Alkaline Phosphatase: 99 IU/L (ref 39–117)
Bilirubin Total: 0.3 mg/dL (ref 0.0–1.2)
Bilirubin, Direct: 0.11 mg/dL (ref 0.00–0.40)
Total Protein: 6.8 g/dL (ref 6.0–8.5)

## 2019-03-23 LAB — HEPATITIS B SURFACE ANTIBODY,QUALITATIVE: Hep B Surface Ab, Qual: NONREACTIVE

## 2019-03-26 ENCOUNTER — Telehealth: Payer: Self-pay

## 2019-03-26 NOTE — Telephone Encounter (Signed)
Pt notified of lab results via mychart. 

## 2019-03-26 NOTE — Telephone Encounter (Signed)
-----   Message from Midge Minium, MD sent at 03/24/2019  2:33 PM EST ----- Let the patient know that she will need a vaccination for both hepatitis A and hepatitis B.  Her liver enzymes continue to remain high and I believe she should have a repeat check in 1 month to see if they are trending down as she tries to decrease her BMI.

## 2019-04-05 ENCOUNTER — Other Ambulatory Visit: Payer: Self-pay

## 2019-04-05 ENCOUNTER — Ambulatory Visit (INDEPENDENT_AMBULATORY_CARE_PROVIDER_SITE_OTHER): Payer: BC Managed Care – PPO | Admitting: Gastroenterology

## 2019-04-05 DIAGNOSIS — Z23 Encounter for immunization: Secondary | ICD-10-CM

## 2019-05-10 ENCOUNTER — Ambulatory Visit: Payer: BC Managed Care – PPO | Admitting: Gastroenterology

## 2019-05-10 ENCOUNTER — Other Ambulatory Visit: Payer: Self-pay

## 2019-05-10 ENCOUNTER — Other Ambulatory Visit (INDEPENDENT_AMBULATORY_CARE_PROVIDER_SITE_OTHER): Payer: BC Managed Care – PPO

## 2019-05-10 DIAGNOSIS — Z23 Encounter for immunization: Secondary | ICD-10-CM

## 2019-05-10 DIAGNOSIS — R7401 Elevation of levels of liver transaminase levels: Secondary | ICD-10-CM | POA: Diagnosis not present

## 2019-05-11 LAB — HEPATIC FUNCTION PANEL
ALT: 97 IU/L — ABNORMAL HIGH (ref 0–32)
AST: 62 IU/L — ABNORMAL HIGH (ref 0–40)
Albumin: 4.1 g/dL (ref 3.8–4.8)
Alkaline Phosphatase: 78 IU/L (ref 39–117)
Bilirubin Total: 0.5 mg/dL (ref 0.0–1.2)
Bilirubin, Direct: 0.17 mg/dL (ref 0.00–0.40)
Total Protein: 6.6 g/dL (ref 6.0–8.5)

## 2019-06-20 ENCOUNTER — Other Ambulatory Visit: Payer: Self-pay

## 2019-06-20 ENCOUNTER — Encounter: Payer: Self-pay | Admitting: Gastroenterology

## 2019-06-20 ENCOUNTER — Ambulatory Visit: Payer: BC Managed Care – PPO | Admitting: Gastroenterology

## 2019-06-20 VITALS — BP 128/83 | HR 90 | Temp 97.6°F | Ht 65.5 in | Wt 305.6 lb

## 2019-06-20 DIAGNOSIS — R7401 Elevation of levels of liver transaminase levels: Secondary | ICD-10-CM

## 2019-06-20 NOTE — Progress Notes (Signed)
Primary Care Physician: Tower, Audrie Gallus, MD  Primary Gastroenterologist:  Dr. Midge Minium  Chief Complaint  Patient presents with  . Elevated Hepatic Enzymes    HPI: Tracey Cunningham is a 39 y.o. female here for follow-up of abnormal liver enzymes.  The patient had been seen back in January and was found to have abdominal pain consistent with musculoskeletal pain.  The patient then had labs sent off for possible causes of her abnormal liver enzymes.  At that time the patient's BMI was over 53.  The patient was recommended at that time to undergo vaccination for hepatitis A and hepatitis B.  The blood work did not show any cause for her abnormal liver enzymes.  The patient was told to go on a Mediterranean diet and try and lose weight with a follow-up in 3 months to see if her liver enzymes had improved.  The patient's most recent lab work was done at the end of February and showed improvement from her previous liver enzymes:  Component     Latest Ref Rng & Units 01/14/2019 03/22/2019 05/10/2019  AST     0 - 40 IU/L 65 (H) 74 (H) 62 (H)  ALT     0 - 32 IU/L 123 (H) 138 (H) 97 (H)   The patient reports that she has been on the Mediterranean diet and stopped eating any red meat or pork and just eats fish and chicken.  She has lost 20 pounds since last seeing me.  She thinks at least 5 those pounds have been since her last blood draw.  The patient abdominal pain is much less frequent and she is only had a few episodes of abdominal pain recently.  Past Medical History:  Diagnosis Date  . Anemia    iron deficiency   . Cerumen impaction   . Hypertension    borderline  . Irregular periods    [facility]   . Obesity    weight loss with diet and exercise     Current Outpatient Medications  Medication Sig Dispense Refill  . famotidine (PEPCID) 20 MG tablet Take 20 mg by mouth as needed.      No current facility-administered medications for this visit.    Allergies as of 06/20/2019   . (No Known Allergies)    ROS:  General: Negative for anorexia, weight loss, fever, chills, fatigue, weakness. ENT: Negative for hoarseness, difficulty swallowing , nasal congestion. CV: Negative for chest pain, angina, palpitations, dyspnea on exertion, peripheral edema.  Respiratory: Negative for dyspnea at rest, dyspnea on exertion, cough, sputum, wheezing.  GI: See history of present illness. GU:  Negative for dysuria, hematuria, urinary incontinence, urinary frequency, nocturnal urination.  Endo: Negative for unusual weight change.    Physical Examination:   Ht 5' 5.5" (1.664 m)   BMI 53.62 kg/m   General: Well-nourished, well-developed in no acute distress.  Eyes: No icterus. Conjunctivae pink. Lungs: Clear to auscultation bilaterally. Non-labored. Heart: Regular rate and rhythm, no murmurs rubs or gallops.  Abdomen: Bowel sounds are normal, nontender, nondistended, no hepatosplenomegaly or masses, no abdominal bruits or hernia , no rebound or guarding.   Extremities: No lower extremity edema. No clubbing or deformities. Neuro: Alert and oriented x 3.  Grossly intact. Skin: Warm and dry, no jaundice.   Psych: Alert and cooperative, normal mood and affect.  Labs:    Imaging Studies: No results found.  Assessment and Plan:   Tracey Cunningham is a 39  y.o. y/o female who comes in today with a history of abnormal liver enzymes that appear to have improved with weight loss.  The patient will have her labs checked again today and has been encouraged to continue what she is doing to continue her weight loss.  The patient will be notified with the lab results.  She has been explained the plan and agrees with it.     Lucilla Lame, MD. Marval Regal    Note: This dictation was prepared with Dragon dictation along with smaller phrase technology. Any transcriptional errors that result from this process are unintentional.

## 2019-06-21 DIAGNOSIS — R7401 Elevation of levels of liver transaminase levels: Secondary | ICD-10-CM | POA: Diagnosis not present

## 2019-06-22 LAB — HEPATIC FUNCTION PANEL
ALT: 63 IU/L — ABNORMAL HIGH (ref 0–32)
AST: 46 IU/L — ABNORMAL HIGH (ref 0–40)
Albumin: 4.4 g/dL (ref 3.8–4.8)
Alkaline Phosphatase: 89 IU/L (ref 39–117)
Bilirubin Total: 0.3 mg/dL (ref 0.0–1.2)
Bilirubin, Direct: 0.13 mg/dL (ref 0.00–0.40)
Total Protein: 6.5 g/dL (ref 6.0–8.5)

## 2019-06-24 ENCOUNTER — Telehealth: Payer: Self-pay

## 2019-06-24 NOTE — Telephone Encounter (Signed)
Pt notified of lab results via mychart. 

## 2019-06-24 NOTE — Telephone Encounter (Signed)
-----   Message from Midge Minium, MD sent at 06/22/2019  9:58 AM EDT ----- Let the patient know that her liver enzymes have significantly gone down since 1 month ago and she should continue losing weight and recheck her liver enzymes in 3 months.

## 2019-07-12 ENCOUNTER — Other Ambulatory Visit: Payer: Self-pay

## 2019-07-12 ENCOUNTER — Ambulatory Visit (INDEPENDENT_AMBULATORY_CARE_PROVIDER_SITE_OTHER): Payer: BC Managed Care – PPO | Admitting: Obstetrics

## 2019-07-12 ENCOUNTER — Encounter: Payer: Self-pay | Admitting: Obstetrics

## 2019-07-12 VITALS — BP 120/80 | Ht 66.0 in | Wt 309.0 lb

## 2019-07-12 DIAGNOSIS — Z01419 Encounter for gynecological examination (general) (routine) without abnormal findings: Secondary | ICD-10-CM

## 2019-07-12 NOTE — Patient Instructions (Signed)
Breast Self-Awareness Breast self-awareness means being familiar with how your breasts look and feel. It involves checking your breasts regularly and reporting any changes to your health care provider. Practicing breast self-awareness is important. Sometimes changes may not be harmful (are benign), but sometimes a change in your breasts can be a sign of a serious medical problem. It is important to learn how to do this procedure correctly so that you can catch problems early, when treatment is more likely to be successful. All women should practice breast self-awareness, including women who have had breast implants. What you need:  A mirror.  A well-lit room. How to do a breast self-exam A breast self-exam is one way to learn what is normal for your breasts and whether your breasts are changing. To do a breast self-exam: Look for changes  1. Remove all the clothing above your waist. 2. Stand in front of a mirror in a room with good lighting. 3. Put your hands on your hips. 4. Push your hands firmly downward. 5. Compare your breasts in the mirror. Look for differences between them (asymmetry), such as: ? Differences in shape. ? Differences in size. ? Puckers, dips, and bumps in one breast and not the other. 6. Look at each breast for changes in the skin, such as: ? Redness. ? Scaly areas. 7. Look for changes in your nipples, such as: ? Discharge. ? Bleeding. ? Dimpling. ? Redness. ? A change in position. Feel for changes Carefully feel your breasts for lumps and changes. It is best to do this while lying on your back on the floor, and again while sitting or standing in the tub or shower with soapy water on your skin. Feel each breast in the following way: 1. Place the arm on the side of the breast you are examining above your head. 2. Feel your breast with the other hand. 3. Start in the nipple area and make -inch (2 cm) overlapping circles to feel your breast. Use the pads of your  three middle fingers to do this. Apply light pressure, then medium pressure, then firm pressure. The light pressure will allow you to feel the tissue closest to the skin. The medium pressure will allow you to feel the tissue that is a little deeper. The firm pressure will allow you to feel the tissue close to the ribs. 4. Continue the overlapping circles, moving downward over the breast until you feel your ribs below your breast. 5. Move one finger-width toward the center of the body. Continue to use the -inch (2 cm) overlapping circles to feel your breast as you move slowly up toward your collarbone. 6. Continue the up-and-down exam using all three pressures until you reach your armpit.  Write down what you find Writing down what you find can help you remember what to discuss with your health care provider. Write down:  What is normal for each breast.  Any changes that you find in each breast, including: ? The kind of changes you find. ? Any pain or tenderness. ? Size and location of any lumps.  Where you are in your menstrual cycle, if you are still menstruating. General tips and recommendations  Examine your breasts every month.  If you are breastfeeding, the best time to examine your breasts is after a feeding or after using a breast pump.  If you menstruate, the best time to examine your breasts is 5-7 days after your period. Breasts are generally lumpier during menstrual periods, and it may   be more difficult to notice changes.  With time and practice, you will become more familiar with the variations in your breasts and more comfortable with the exam. Contact a health care provider if you:  See a change in the shape or size of your breasts or nipples.  See a change in the skin of your breast or nipples, such as a reddened or scaly area.  Have unusual discharge from your nipples.  Find a lump or thick area that was not there before.  Have pain in your breasts.  Have any  concerns related to your breast health. Summary  Breast self-awareness includes looking for physical changes in your breasts, as well as feeling for any changes within your breasts.  Breast self-awareness should be performed in front of a mirror in a well-lit room.  You should examine your breasts every month. If you menstruate, the best time to examine your breasts is 5-7 days after your menstrual period.  Let your health care provider know of any changes you notice in your breasts, including changes in size, changes on the skin, pain or tenderness, or unusual fluid from your nipples. This information is not intended to replace advice given to you by your health care provider. Make sure you discuss any questions you have with your health care provider. Document Revised: 10/17/2017 Document Reviewed: 10/17/2017 Elsevier Patient Education  2020 Elsevier Inc.  

## 2019-07-12 NOTE — Progress Notes (Signed)
Gynecology Annual Exam  PCP: Judy Pimple, MD  Chief Complaint:  Chief Complaint  Patient presents with  . Gynecologic Exam    lower abdominal pain    History of Present Illness Ms. Tracey Cunningham is a 39 y.o. G0P0000 who LMP was Patient's last menstrual period was 06/18/2019 (exact date)., presents today for her annual examination.  Her menses are regular, last 7 days, with the first 3-4 heavier flow.She reports painful cramping during the first few days of her menses- she uses Motrin to treat this, and this works well for her. She does not have vasomotor sx. She uses only Motrin for dysmenorrhea.  She is not sexually active. She does not have vaginal dryness.  Last Pap: 04/20/2016.  Results were: no abnormalities /neg HPV DNA.  Hx of STDs: none  Last mammogram: 03/16/2016 Results were: normal--routine follow-up in 2 years at age 66. There is a FH of breast cancer with her MGM. There is no FH of ovarian cancer. The patient does do self-breast exams.  Colonoscopy: baseline will be due at age 12. DEXA: has not been screened for osteoporosis  Tobacco use: The patient denies current or previous tobacco use. Alcohol use: none Exercise: not active  She does get adequate calcium and Vitamin D in her diet. She is presently on a Meditrranean diet and has lost 35 pounds.  The patient is not sexually active. She currently uses None for contraception. The patient wears seatbelts: yes.   last pap: was normal  The patient has regular exercise: no.  The patient has ever been transfused or tattooed?: no.  The patient reports that domestic violence in her life is absent.   Review of Systems: ROS   Past Medical History:  Past Medical History:  Diagnosis Date  . Anemia    iron deficiency   . Cerumen impaction   . Hypertension    borderline  . Irregular periods    [facility]   . Obesity    weight loss with diet and exercise     Past Surgical History:  Past Surgical  History:  Procedure Laterality Date  . APPENDECTOMY  10/2005  . CHOLECYSTECTOMY    . foot fracture     right foot     Medications: Prior to Admission medications   Medication Sig Start Date End Date Taking? Authorizing Provider  famotidine (PEPCID) 20 MG tablet Take 20 mg by mouth as needed.    Yes [provider]    Allergies:  No Known Allergies  Gynecologic History: Patient's last menstrual period was 06/18/2019 (exact date).  Obstetric History: G0P0000  Social History:  Social History   Socioeconomic History  . Marital status: Single    Spouse name: Not on file  . Number of children: Not on file  . Years of education: Not on file  . Highest education level: Not on file  Occupational History  . Not on file  Tobacco Use  . Smoking status: Never Smoker  . Smokeless tobacco: Never Used  Substance and Sexual Activity  . Alcohol use: Yes    Alcohol/week: 0.0 standard drinks    Comment: wine- occ  . Drug use: No  . Sexual activity: Not Currently    Birth control/protection: None  Other Topics Concern  . Not on file  Social History Narrative  . Not on file   Social Determinants of Health   Financial Resource Strain:   . Difficulty of Paying Living Expenses:   Food Insecurity:   .  Worried About Charity fundraiser in the Last Year:   . Arboriculturist in the Last Year:   Transportation Needs:   . Film/video editor (Medical):   Marland Kitchen Lack of Transportation (Non-Medical):   Physical Activity:   . Days of Exercise per Week:   . Minutes of Exercise per Session:   Stress:   . Feeling of Stress :   Social Connections:   . Frequency of Communication with Friends and Family:   . Frequency of Social Gatherings with Friends and Family:   . Attends Religious Services:   . Active Member of Clubs or Organizations:   . Attends Archivist Meetings:   Marland Kitchen Marital Status:   Intimate Partner Violence:   . Fear of Current or Ex-Partner:   . Emotionally  Abused:   Marland Kitchen Physically Abused:   . Sexually Abused:     Family History:  Family History  Problem Relation Age of Onset  . Hypertension Maternal Grandfather   . Heart attack Maternal Grandfather   . Breast cancer Mother 68  . Breast cancer Maternal Grandmother        70's  . Breast cancer Paternal Grandmother        30's     Physical Exam Vitals:  Vitals:   07/12/19 1532  BP: 120/80   Physical Exam   Assessment: 39 y.o. G0P0000 well woman exam  Plan:  1) Contraception Education given regarding options for contraception was declined ( not sexually active).   2) STI screening was offered  And declined  3) Pap deferred for 2 more years (q 5 years) 4) Routine healthcare maintenance including cholesterol, diabetes screening is being done by her PCP  5) Follow up 1 year for routine annual exam Importance of performing BSE stressed.  Mammogram in 2 years.   6) Gentle discussion regarding continuing weight loss and the option of referral to surgical weight loss specialist introduced.  1. Encounter for annual routine gynecological examination      Imagene Riches, CNM  07/12/2019 4:49 PM

## 2019-07-17 ENCOUNTER — Encounter: Payer: Self-pay | Admitting: Obstetrics and Gynecology

## 2019-09-04 ENCOUNTER — Ambulatory Visit: Payer: BC Managed Care – PPO

## 2019-09-06 ENCOUNTER — Other Ambulatory Visit: Payer: Self-pay

## 2019-09-06 ENCOUNTER — Ambulatory Visit (INDEPENDENT_AMBULATORY_CARE_PROVIDER_SITE_OTHER): Payer: BC Managed Care – PPO | Admitting: Gastroenterology

## 2019-09-06 DIAGNOSIS — Z23 Encounter for immunization: Secondary | ICD-10-CM | POA: Diagnosis not present

## 2019-09-23 ENCOUNTER — Other Ambulatory Visit: Payer: Self-pay

## 2019-09-23 DIAGNOSIS — R7401 Elevation of levels of liver transaminase levels: Secondary | ICD-10-CM

## 2020-04-01 DIAGNOSIS — Z20822 Contact with and (suspected) exposure to covid-19: Secondary | ICD-10-CM | POA: Diagnosis not present

## 2020-04-10 ENCOUNTER — Ambulatory Visit: Payer: BC Managed Care – PPO | Admitting: Family Medicine

## 2020-04-10 ENCOUNTER — Ambulatory Visit (INDEPENDENT_AMBULATORY_CARE_PROVIDER_SITE_OTHER)
Admission: RE | Admit: 2020-04-10 | Discharge: 2020-04-10 | Disposition: A | Discharge status: Home / Self Care | Payer: BC Managed Care – PPO | Source: Ambulatory Visit | Attending: Family Medicine | Admitting: Family Medicine

## 2020-04-10 ENCOUNTER — Encounter: Payer: Self-pay | Admitting: Family Medicine

## 2020-04-10 ENCOUNTER — Other Ambulatory Visit: Payer: Self-pay

## 2020-04-10 VITALS — BP 130/78 | HR 99 | Temp 97.2°F | Ht 66.0 in | Wt 327.2 lb

## 2020-04-10 DIAGNOSIS — R1032 Left lower quadrant pain: Secondary | ICD-10-CM | POA: Insufficient documentation

## 2020-04-10 NOTE — Patient Instructions (Addendum)
Check out chair yoga programs on line   Get moving a bit more if you can  Even walking around in the house is helpful   Xray of hip today  I wonder if your hip is the cause of the pain   Ibuprofen is fine  You can try ice and heat

## 2020-04-10 NOTE — Assessment & Plan Note (Signed)
Nl abd exam  Pain with int rotation of L hip (reproduces her pain) Suspect hip inflammation vs OA Xray ordered  Adv ice/heat and ibuprofen  Gentle stretching

## 2020-04-10 NOTE — Assessment & Plan Note (Signed)
Discussed how this problem influences overall health and the risks it imposes  Reviewed plan for weight loss with lower calorie diet (via better food choices and also portion control or program like weight watchers) and exercise building up to or more than 30 minutes 5 days per week including some aerobic activity   Disc inc activity- suggested chair yoga and walking to start

## 2020-04-10 NOTE — Progress Notes (Signed)
Subjective:    Patient ID: Tracey Cunningham, female    DOB: December 13, 1980, 40 y.o.   MRN: [id number]  This visit occurred during the SARS-CoV-2 public health emergency.  Safety protocols were in place, including screening questions prior to the visit, additional usage of staff PPE, and extensive cleaning of exam room while observing appropriate contact time as indicated for disinfecting solutions.    HPI Pt presents with left low abd/L inner thigh pain   Wt Readings from Last 3 Encounters:  04/10/20 (!) 327 lb 4 oz (148.4 kg)  07/12/19 (!) 309 lb (140.2 kg)  06/20/19 (!) 305 lb 9.6 oz (138.6 kg)   52.82 kg/m  Pain in lower left abdomen and crease of L thigh/ a bit in lower back  2-3 times per week  For a long time   Typically happens at night  Not with activity that she knows of   No leg swelling   Has a h/o IBS  occ blood in stool  2-3 times per month (not lately)  Bright red blood  Some rectal pain when that happens   Still has periods and cramps (this feels different)   Ibuprofen helps Trying a heating pad   Patient Active Problem List   Diagnosis Date Noted  . Left groin pain 04/10/2020  . Elevated liver transaminase level 01/01/2019  . Lump on finger, right 10/10/2018  . Family history of breast cancer in mother 02/02/2016  . GERD (gastroesophageal reflux disease) 11/05/2014  . Routine gynecological examination 01/31/2012  . Routine general medical examination at a health care facility 01/31/2012  . IRRITABLE BOWEL SYNDROME 04/23/2008  . Morbid obesity (HCC) 04/19/2007  . ANEMIA-IRON DEFICIENCY 08/24/2006  . Essential hypertension 08/24/2006  . MENORRHALGIA 08/24/2006   Past Medical History:  Diagnosis Date  . Anemia    iron deficiency   . Cerumen impaction   . Family history of breast cancer    5/21 cancer genetic testing letter sent  . Hypertension    borderline  . Irregular periods    [facility]   . Obesity    weight loss with diet and  exercise    Past Surgical History:  Procedure Laterality Date  . APPENDECTOMY  10/2005  . CHOLECYSTECTOMY    . foot fracture     right foot    Social History   Tobacco Use  . Smoking status: Never Smoker  . Smokeless tobacco: Never Used  Vaping Use  . Vaping Use: Never used  Substance Use Topics  . Alcohol use: Yes    Alcohol/week: 0.0 standard drinks    Comment: wine- occ  . Drug use: No   Family History  Problem Relation Age of Onset  . Hypertension Maternal Grandfather   . Heart attack Maternal Grandfather   . Breast cancer Mother 13       triple neg  . Breast cancer Maternal Grandmother        70's  . Breast cancer Paternal Grandmother        88's   No Known Allergies Current Outpatient Medications on File Prior to Visit  Medication Sig Dispense Refill  . famotidine (PEPCID) 20 MG tablet Take 20 mg by mouth as needed.     No current facility-administered medications on file prior to visit.    Review of Systems     Objective:   Physical Exam Constitutional:      General: She is not in acute distress.    Appearance: Normal appearance.  She is obese. She is not ill-appearing.  Eyes:     General: No scleral icterus.    Conjunctiva/sclera: Conjunctivae normal.  Cardiovascular:     Rate and Rhythm: Regular rhythm. Tachycardia present.     Heart sounds: Normal heart sounds.  Pulmonary:     Effort: Pulmonary effort is normal. No respiratory distress.     Breath sounds: Normal breath sounds.  Abdominal:     General: Bowel sounds are normal. There is no distension.     Palpations: Abdomen is soft. There is no mass.     Tenderness: There is no abdominal tenderness. There is no guarding or rebound.     Hernia: No hernia is present.  Musculoskeletal:     Cervical back: Normal range of motion and neck supple. No tenderness.     Right hip: Normal.     Left hip: Tenderness present. No bony tenderness or crepitus. Decreased range of motion.     Right lower leg: No  edema.     Left lower leg: No edema.     Comments: L hip  Groin pain reproduced with internal rotation of hip (pain limits full rom)  Mild trochanteric tenderness Nl SLR Nl rom of LS  Tight hamstrings   Skin:    General: Skin is warm and dry.     Coloration: Skin is not pale.     Findings: No erythema or rash.  Neurological:     Mental Status: She is alert.     Motor: No weakness.     Coordination: Coordination normal.     Deep Tendon Reflexes: Reflexes normal.  Psychiatric:        Mood and Affect: Mood normal.           Assessment & Plan:   Problem List Items Addressed This Visit      Other   Morbid obesity (HCC)    Discussed how this problem influences overall health and the risks it imposes  Reviewed plan for weight loss with lower calorie diet (via better food choices and also portion control or program like weight watchers) and exercise building up to or more than 30 minutes 5 days per week including some aerobic activity   Disc inc activity- suggested chair yoga and walking to start      Left groin pain - Primary    Nl abd exam  Pain with int rotation of L hip (reproduces her pain) Suspect hip inflammation vs OA Xray ordered  Adv ice/heat and ibuprofen  Gentle stretching       Relevant Orders   DG Hip Unilat W OR W/O Pelvis 2-3 Views Left (Completed)

## 2020-04-13 ENCOUNTER — Telehealth: Payer: Self-pay | Admitting: Family Medicine

## 2020-04-13 DIAGNOSIS — R1032 Left lower quadrant pain: Secondary | ICD-10-CM

## 2020-04-13 DIAGNOSIS — R102 Pelvic and perineal pain: Secondary | ICD-10-CM

## 2020-04-13 NOTE — Telephone Encounter (Signed)
-----   Message from Shon Millet, [place] sent at 04/13/2020 12:24 PM EST ----- Pt notified of xray results and Dr. Royden Purl comments. Pt agrees with [place], pt did advise me she is out of town this week but after that can go any time and doesn't have a preference either [place] or [place] is fine, I advise pt PCP will put order in and our [facility] will call to schedule appt

## 2020-04-14 ENCOUNTER — Other Ambulatory Visit: Payer: Self-pay | Admitting: Family Medicine

## 2020-04-14 NOTE — Addendum Note (Signed)
Addended by: Maisie Fus on: 04/14/2020 04:39 PM   Modules accepted: Orders

## 2020-04-24 ENCOUNTER — Other Ambulatory Visit: Payer: BC Managed Care – PPO

## 2020-05-01 ENCOUNTER — Other Ambulatory Visit: Payer: Self-pay

## 2020-05-01 ENCOUNTER — Ambulatory Visit
Admission: RE | Admit: 2020-05-01 | Discharge: 2020-05-01 | Disposition: A | Discharge status: Home / Self Care | Payer: BC Managed Care – PPO | Source: Ambulatory Visit | Attending: Family Medicine | Admitting: Family Medicine

## 2020-05-01 DIAGNOSIS — R102 Pelvic and perineal pain: Secondary | ICD-10-CM

## 2020-05-01 DIAGNOSIS — D259 Leiomyoma of uterus, unspecified: Secondary | ICD-10-CM | POA: Diagnosis not present

## 2020-05-01 DIAGNOSIS — R1032 Left lower quadrant pain: Secondary | ICD-10-CM

## 2020-05-05 ENCOUNTER — Telehealth: Payer: Self-pay

## 2020-05-05 ENCOUNTER — Encounter: Payer: Self-pay | Admitting: Family Medicine

## 2020-05-05 DIAGNOSIS — R1030 Lower abdominal pain, unspecified: Secondary | ICD-10-CM

## 2020-05-05 DIAGNOSIS — R1032 Left lower quadrant pain: Secondary | ICD-10-CM

## 2020-05-05 NOTE — Telephone Encounter (Signed)
Pt calling; had u/s on the 18th ordered by [facility]; results are in Epic; was told to f/u with [place]; sees [facility]; wants to know what her next step(s).  [phone]

## 2020-05-05 NOTE — Telephone Encounter (Signed)
Ultrasound reviewed. Fibroid noted.   Normal endometrium.  No adnexal masses seen. Discussed this with the patient. The patient has been having pain on the other side, and it is unrelated to her menstrual cycle. Have recommended she follow up with her PCP asi her pain is not related to GYN issue.  She verbalizes understanding. Mirna Mires, CNM  05/05/2020 3:04 PM

## 2020-05-07 NOTE — Telephone Encounter (Signed)
See pt's response to getting a CT

## 2020-05-08 DIAGNOSIS — R1030 Lower abdominal pain, unspecified: Secondary | ICD-10-CM | POA: Insufficient documentation

## 2020-05-26 ENCOUNTER — Other Ambulatory Visit: Payer: Self-pay

## 2020-05-26 ENCOUNTER — Ambulatory Visit
Admission: RE | Admit: 2020-05-26 | Discharge: 2020-05-26 | Disposition: A | Discharge status: Home / Self Care | Payer: BC Managed Care – PPO | Source: Ambulatory Visit | Attending: Family Medicine | Admitting: Family Medicine

## 2020-05-26 DIAGNOSIS — R1032 Left lower quadrant pain: Secondary | ICD-10-CM

## 2020-05-26 MED ORDER — IOPAMIDOL (ISOVUE-300) INJECTION 61%
100.0000 mL | Freq: Once | INTRAVENOUS | Status: AC | PRN
  Administered 2020-05-26: 100 mL via INTRAVENOUS

## 2020-06-05 DIAGNOSIS — H6123 Impacted cerumen, bilateral: Secondary | ICD-10-CM | POA: Diagnosis not present

## 2020-06-09 ENCOUNTER — Encounter: Payer: Self-pay | Admitting: Family Medicine

## 2020-06-09 ENCOUNTER — Other Ambulatory Visit: Payer: Self-pay

## 2020-06-09 ENCOUNTER — Ambulatory Visit: Payer: BC Managed Care – PPO | Admitting: Family Medicine

## 2020-06-09 VITALS — BP 140/76 | HR 97 | Temp 96.9°F | Ht 66.0 in | Wt 324.4 lb

## 2020-06-09 DIAGNOSIS — H6123 Impacted cerumen, bilateral: Secondary | ICD-10-CM | POA: Diagnosis not present

## 2020-06-09 NOTE — Patient Instructions (Signed)
Ears are clear after simple irrigation  It may be a good idea to use debrox several times monthly  to prevent significant build up  Let us know if pain or other symptoms

## 2020-06-09 NOTE — Assessment & Plan Note (Signed)
Resolved after home use of debrox and then simple irrigation here  inst to try debrox twice monthly to keep cerumen moveable  inst to call if pain or other symptoms  Hearing improved at end of visit

## 2020-06-09 NOTE — Progress Notes (Signed)
Subjective:    Patient ID: Tracey Cunningham, female    DOB: 15-Aug-1980, 40 y.o.   MRN: [id number]  This visit occurred during the SARS-CoV-2 public health emergency.  Safety protocols were in place, including screening questions prior to the visit, additional usage of staff PPE, and extensive cleaning of exam room while observing appropriate contact time as indicated for disinfecting solutions.    HPI Pt presents for c/o ear fullness  Wt Readings from Last 3 Encounters:  06/09/20 (!) 324 lb 7 oz (147.2 kg)  04/10/20 (!) 327 lb 4 oz (148.4 kg)  07/12/19 (!) 309 lb (140.2 kg)   52.37 kg/m  She has a h/o cerumen impaction   R ear hurt last week [facility]  Used debrox over the weekend , a bit came out on its own Can hear better after using that   Other ear-muffled   Patient Active Problem List   Diagnosis Date Noted  . Lower abdominal pain 05/08/2020  . Left groin pain 04/10/2020  . Elevated liver transaminase level 01/01/2019  . Lump on finger, right 10/10/2018  . Family history of breast cancer in mother 02/02/2016  . GERD (gastroesophageal reflux disease) 11/05/2014  . Routine gynecological examination 01/31/2012  . Routine general medical examination at a health care facility 01/31/2012  . IRRITABLE BOWEL SYNDROME 04/23/2008  . Morbid obesity (HCC) 04/19/2007  . ANEMIA-IRON DEFICIENCY 08/24/2006  . Essential hypertension 08/24/2006  . MENORRHALGIA 08/24/2006   Past Medical History:  Diagnosis Date  . Anemia    iron deficiency   . Cerumen impaction   . Family history of breast cancer    5/21 cancer genetic testing letter sent  . Hypertension    borderline  . Irregular periods    [facility]   . Obesity    weight loss with diet and exercise    Past Surgical History:  Procedure Laterality Date  . APPENDECTOMY  10/2005  . CHOLECYSTECTOMY    . foot fracture     right foot    Social History   Tobacco Use  . Smoking status: Never Smoker  .  Smokeless tobacco: Never Used  Vaping Use  . Vaping Use: Never used  Substance Use Topics  . Alcohol use: Yes    Alcohol/week: 0.0 standard drinks    Comment: wine- occ  . Drug use: No   Family History  Problem Relation Age of Onset  . Hypertension Maternal Grandfather   . Heart attack Maternal Grandfather   . Breast cancer Mother 67       triple neg  . Breast cancer Maternal Grandmother        70's  . Breast cancer Paternal Grandmother        77's   No Known Allergies Current Outpatient Medications on File Prior to Visit  Medication Sig Dispense Refill  . famotidine (PEPCID) 20 MG tablet Take 20 mg by mouth as needed.     No current facility-administered medications on file prior to visit.    Review of Systems  Constitutional: Negative for activity change, appetite change, fatigue, fever and unexpected weight change.  HENT: Positive for ear pain and hearing loss. Negative for congestion, ear discharge, rhinorrhea, sinus pressure and sore throat.   Eyes: Negative for pain, redness and visual disturbance.  Respiratory: Negative for cough, shortness of breath and wheezing.   Cardiovascular: Negative for chest pain and palpitations.  Gastrointestinal: Negative for abdominal pain, blood in stool, constipation and diarrhea.  Endocrine:  Negative for polydipsia and polyuria.  Genitourinary: Negative for dysuria, frequency and urgency.  Musculoskeletal: Negative for arthralgias, back pain and myalgias.  Skin: Negative for pallor and rash.  Allergic/Immunologic: Negative for environmental allergies.  Neurological: Negative for dizziness, syncope and headaches.  Hematological: Negative for adenopathy. Does not bruise/bleed easily.  Psychiatric/Behavioral: Negative for decreased concentration and dysphoric mood. The patient is not nervous/anxious.        Objective:   Physical Exam Constitutional:      General: She is not in acute distress.    Appearance: Normal appearance. She  is obese. She is not ill-appearing.  HENT:     Head: Normocephalic and atraumatic.     Right Ear: There is impacted cerumen.     Left Ear: There is impacted cerumen.     Ears:     Comments: S/p simple irrigation, cerumen impaction resolved  Improved hearing  Nl appearing TMs with good light reflex Eyes:     General:        Right eye: No discharge.        Left eye: No discharge.     Conjunctiva/sclera: Conjunctivae normal.     Pupils: Pupils are equal, round, and reactive to light.  Musculoskeletal:     Cervical back: Neck supple. No rigidity.  Lymphadenopathy:     Cervical: No cervical adenopathy.  Skin:    Coloration: Skin is not pale.     Findings: No erythema or rash.  Neurological:     Mental Status: She is alert.     Coordination: Coordination normal.  Psychiatric:        Mood and Affect: Mood normal.           Assessment & Plan:   Problem List Items Addressed This Visit      Nervous and Auditory   Cerumen impaction - Primary    Resolved after home use of debrox and then simple irrigation here  inst to try debrox twice monthly to keep cerumen moveable  inst to call if pain or other symptoms  Hearing improved at end of visit

## 2020-08-23 ENCOUNTER — Telehealth: Payer: Self-pay | Admitting: Family Medicine

## 2020-08-23 DIAGNOSIS — R911 Solitary pulmonary nodule: Secondary | ICD-10-CM | POA: Insufficient documentation

## 2020-08-23 NOTE — Telephone Encounter (Signed)
Pt is due for 3-6 mo CT chest to re check pulmonary nodule I placed order for [facility] where she had the last CT Please let her know

## 2020-08-24 NOTE — Telephone Encounter (Signed)
Left VM letting pt know Dr. Tower's comments  

## 2020-09-08 ENCOUNTER — Other Ambulatory Visit: Payer: BC Managed Care – PPO

## 2020-09-25 ENCOUNTER — Ambulatory Visit
Admission: RE | Admit: 2020-09-25 | Discharge: 2020-09-25 | Disposition: A | Discharge status: Home / Self Care | Payer: BC Managed Care – PPO | Source: Ambulatory Visit | Attending: Family Medicine | Admitting: Family Medicine

## 2020-09-25 DIAGNOSIS — R911 Solitary pulmonary nodule: Secondary | ICD-10-CM | POA: Diagnosis not present

## 2020-09-25 DIAGNOSIS — K769 Liver disease, unspecified: Secondary | ICD-10-CM

## 2020-09-25 DIAGNOSIS — R918 Other nonspecific abnormal finding of lung field: Secondary | ICD-10-CM | POA: Diagnosis not present

## 2020-09-28 ENCOUNTER — Telehealth: Payer: Self-pay | Admitting: Family Medicine

## 2020-09-28 DIAGNOSIS — K769 Liver disease, unspecified: Secondary | ICD-10-CM

## 2020-09-28 DIAGNOSIS — R7401 Elevation of levels of liver transaminase levels: Secondary | ICD-10-CM

## 2020-09-28 NOTE — Telephone Encounter (Signed)
-----   Message from Shapale M [place], CMA sent at 09/28/2020  5:22 PM EDT ----- Pt notified of CT results and Dr. Ahmira Boisselle's comments. Pt agreed with MRI of liver. Lab appt scheduled tomorrow (please put orders in), pt said she lives in [place] but works in [place] so she can go to either city for MRI whoever has an appt.  

## 2020-09-28 NOTE — Telephone Encounter (Signed)
Order done She will get a call

## 2020-09-28 NOTE — Telephone Encounter (Signed)
-----   Message from Shon Millet, [place] sent at 09/28/2020  5:22 PM EDT ----- Pt notified of CT results and Dr. Royden Purl comments. Pt agreed with MRI of liver. Lab appt scheduled tomorrow (please put orders in), pt said she lives in [place] but works in [place] so she can go to either city for MRI whoever has an appt.

## 2020-09-29 ENCOUNTER — Other Ambulatory Visit (INDEPENDENT_AMBULATORY_CARE_PROVIDER_SITE_OTHER): Payer: BC Managed Care – PPO

## 2020-09-29 ENCOUNTER — Other Ambulatory Visit: Payer: Self-pay

## 2020-09-29 DIAGNOSIS — R7401 Elevation of levels of liver transaminase levels: Secondary | ICD-10-CM

## 2020-09-29 DIAGNOSIS — K769 Liver disease, unspecified: Secondary | ICD-10-CM | POA: Diagnosis not present

## 2020-09-29 LAB — HEPATIC FUNCTION PANEL
ALT: 167 U/L — ABNORMAL HIGH (ref 0–35)
AST: 108 U/L — ABNORMAL HIGH (ref 0–37)
Albumin: 4.1 g/dL (ref 3.5–5.2)
Alkaline Phosphatase: 77 U/L (ref 39–117)
Bilirubin, Direct: 0.1 mg/dL (ref 0.0–0.3)
Total Bilirubin: 0.5 mg/dL (ref 0.2–1.2)
Total Protein: 6.9 g/dL (ref 6.0–8.3)

## 2020-09-29 LAB — BASIC METABOLIC PANEL
BUN: 10 mg/dL (ref 6–23)
CO2: 23 mEq/L (ref 19–32)
Calcium: 8.8 mg/dL (ref 8.4–10.5)
Chloride: 107 mEq/L (ref 96–112)
Creatinine, Ser: 0.79 mg/dL (ref 0.40–1.20)
GFR: 93.84 mL/min (ref >60.00)
Glucose, Bld: 103 mg/dL — ABNORMAL HIGH (ref 70–99)
Potassium: 4.5 mEq/L (ref 3.5–5.1)
Sodium: 138 mEq/L (ref 135–145)

## 2020-10-21 ENCOUNTER — Encounter: Payer: Self-pay | Admitting: *Deleted

## 2020-11-11 ENCOUNTER — Encounter: Payer: Self-pay | Admitting: Family Medicine

## 2020-11-25 MED ORDER — BENZONATATE 200 MG PO CAPS: 200.0000 mg | ORAL_CAPSULE | Freq: Three times a day (TID) | ORAL | 1 refills | Status: DC | PRN

## 2021-01-09 IMAGING — US US ABDOMEN COMPLETE
1 series · 14 of 25 positions shown · non-contrast
Comparison: None.

CLINICAL DATA: Elevated LFTs, suspect fatty liver

EXAM:
ABDOMEN ULTRASOUND COMPLETE

[Series 1: us abdomen complete · 0.33mm/px · 14 of 71 slices shown]
[im 1/71]
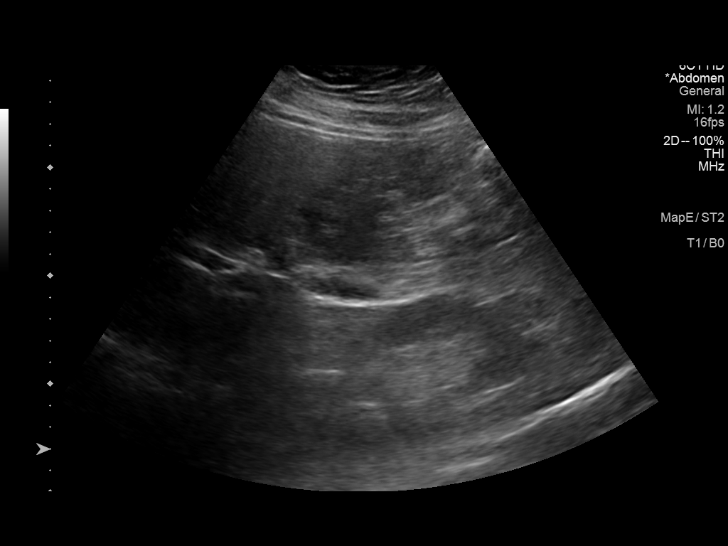
[im 6/71]
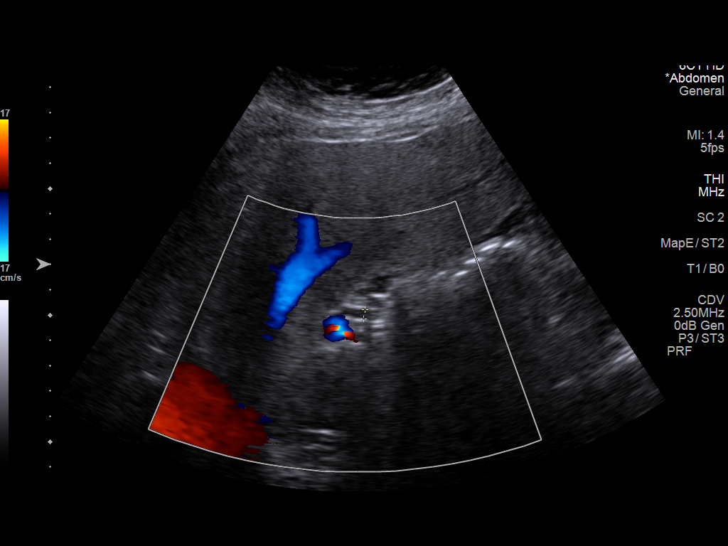
[im 12/71]
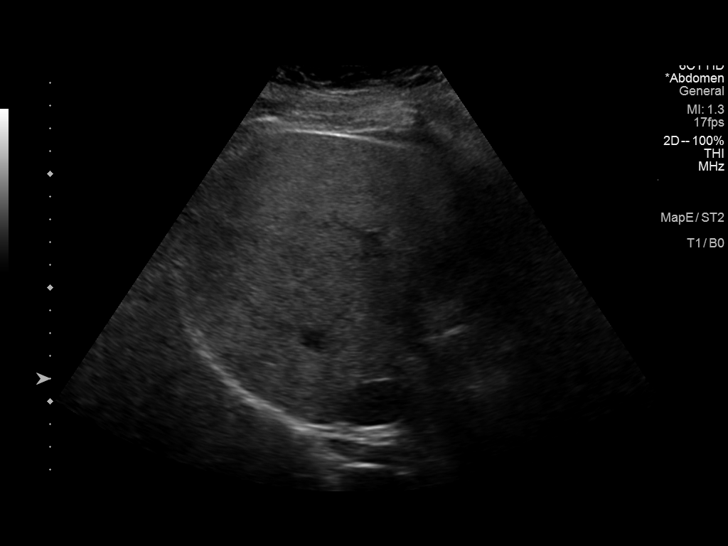
[im 18/71]
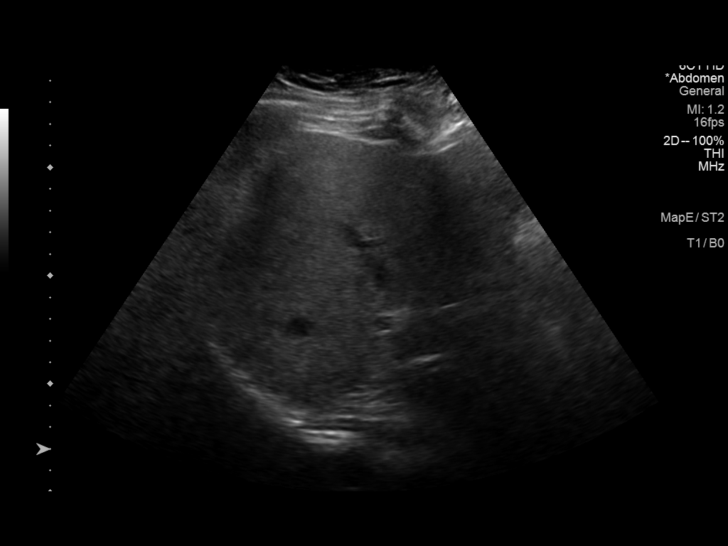
[im 24/71]
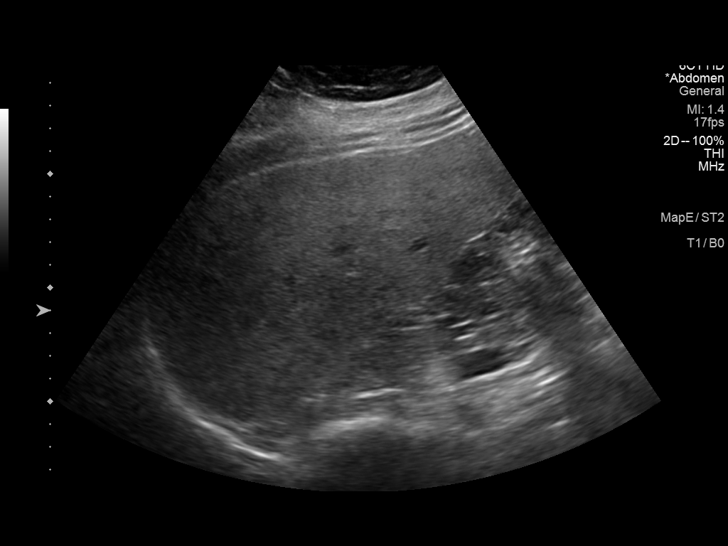
[im 27/71]
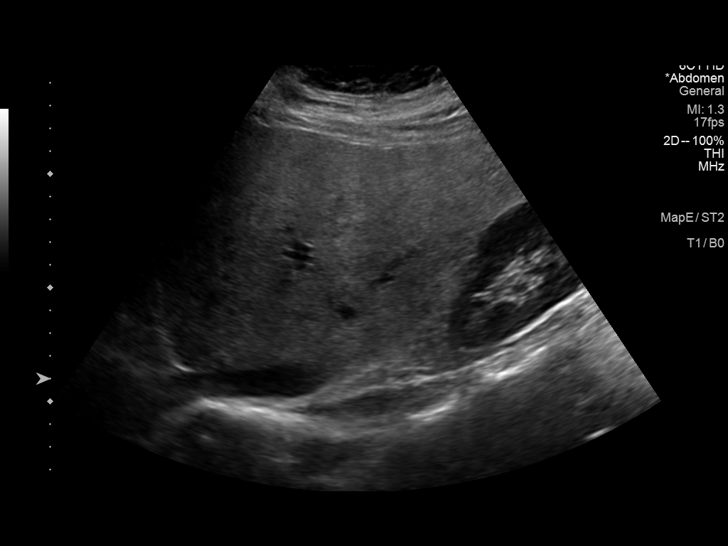
[im 33/71]
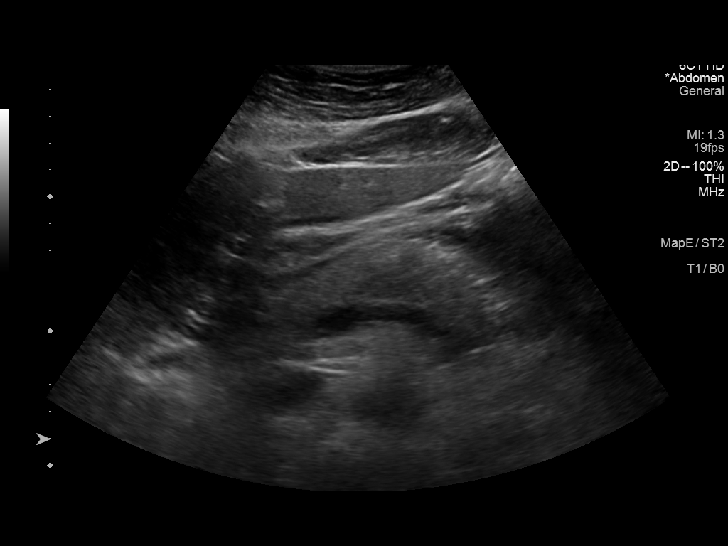
[im 38/71]
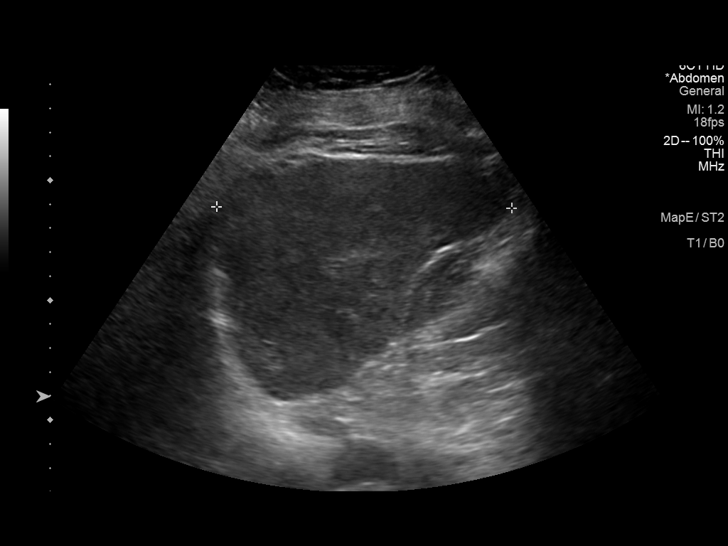
[im 44/71]
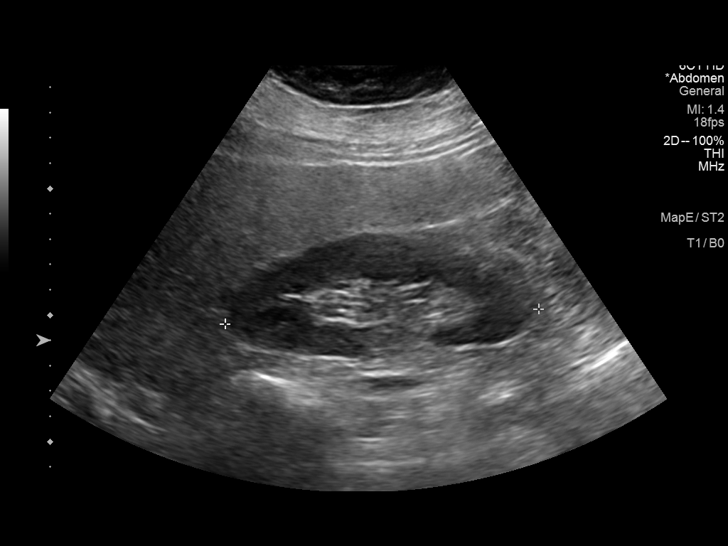
[im 47/71]
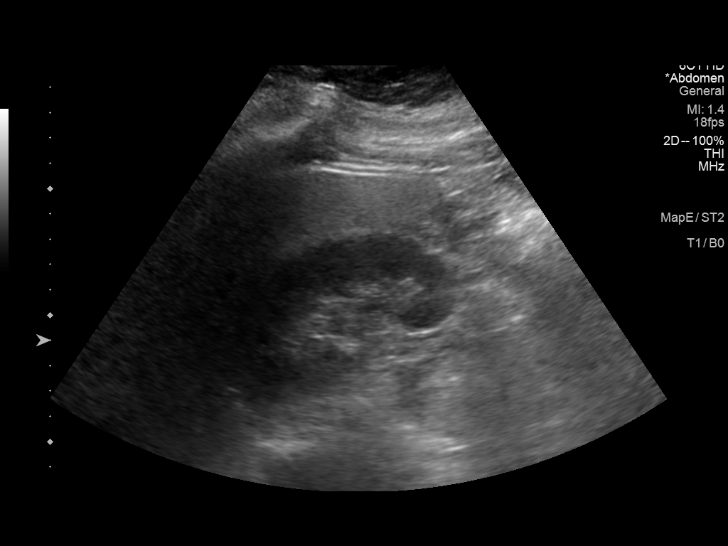
[im 53/71]
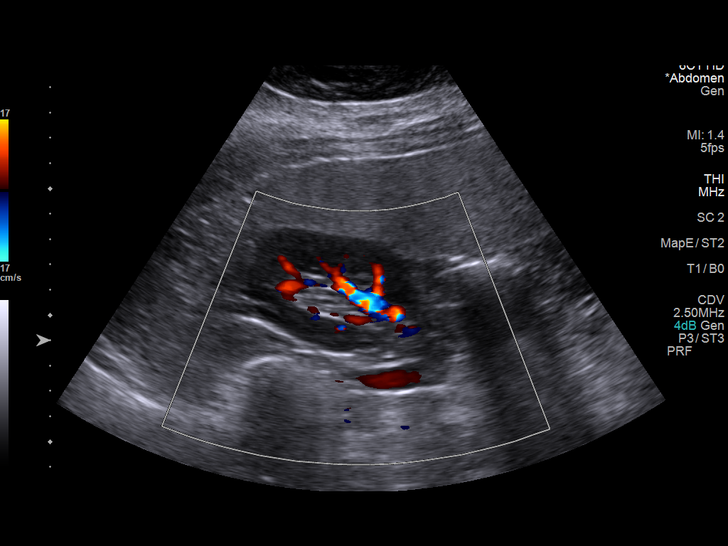
[im 59/71]
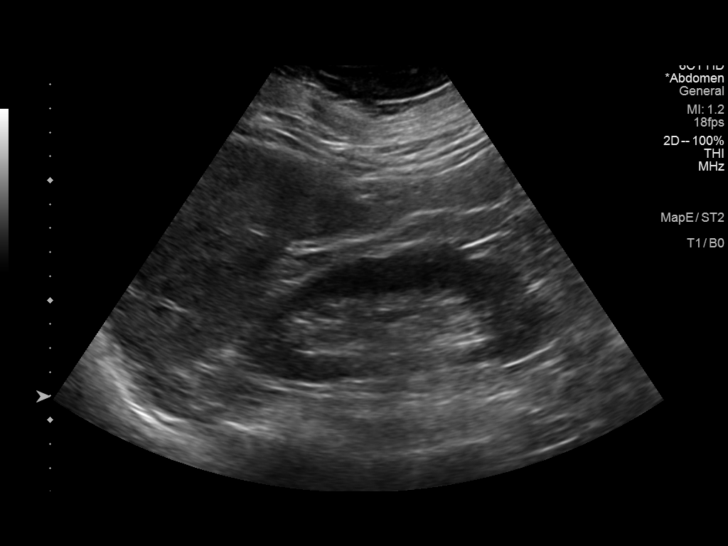
[im 65/71]
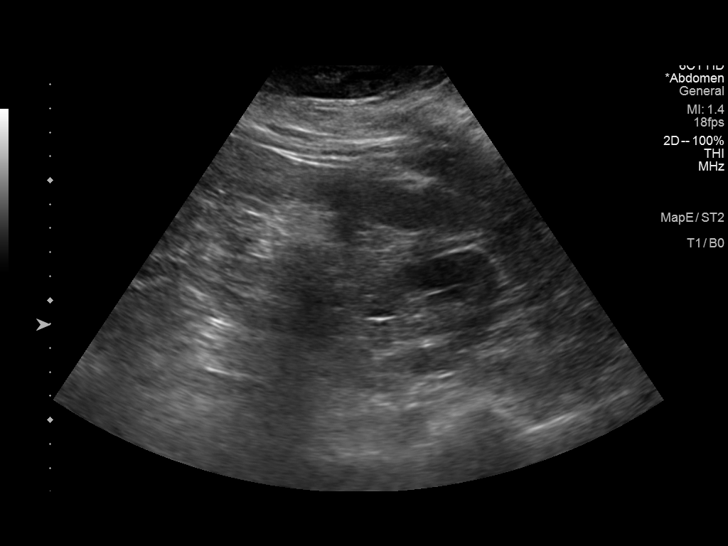
[im 71/71]
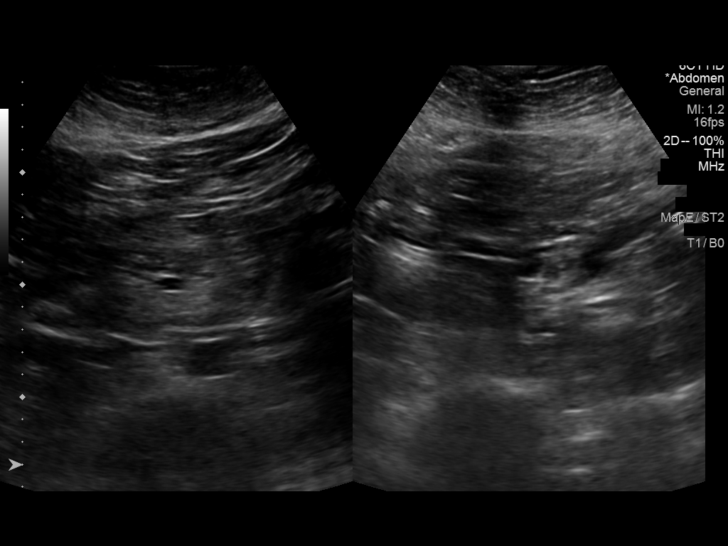

[14 of 25 positions shown; findings below may reference images not displayed]

FINDINGS: Gallbladder: Status post cholecystectomy.

Common bile duct: Diameter: 4 mm

Liver: No focal lesion identified. Within normal limits in
parenchymal echogenicity. Portal vein is patent on color Doppler
imaging with normal direction of blood flow towards the liver.

IVC: No abnormality visualized.

Pancreas: Visualized portion unremarkable.

Spleen: Size and appearance within normal limits.

Right Kidney: Length: 12.3 cm. Echogenicity within normal limits. No
mass or hydronephrosis visualized.

Left Kidney: Length: 13.1 cm. Echogenicity within normal limits. No
mass or hydronephrosis visualized.

Abdominal aorta: No aneurysm visualized.

Other findings: None.
IMPRESSION: 1. No ultrasound abnormality of the liver to explain elevated LFTs.
Normal echogenicity of the parenchyma.

2.  Status post cholecystectomy.

## 2021-02-09 ENCOUNTER — Other Ambulatory Visit: Payer: Self-pay | Admitting: Family Medicine

## 2021-02-09 DIAGNOSIS — Z1231 Encounter for screening mammogram for malignant neoplasm of breast: Secondary | ICD-10-CM

## 2021-03-25 ENCOUNTER — Ambulatory Visit
Admission: RE | Admit: 2021-03-25 | Discharge: 2021-03-25 | Disposition: A | Discharge status: Home / Self Care | Payer: BC Managed Care – PPO | Source: Ambulatory Visit | Attending: Family Medicine | Admitting: Family Medicine

## 2021-03-25 ENCOUNTER — Other Ambulatory Visit: Payer: Self-pay

## 2021-03-25 DIAGNOSIS — Z1231 Encounter for screening mammogram for malignant neoplasm of breast: Secondary | ICD-10-CM | POA: Diagnosis not present

## 2021-07-07 ENCOUNTER — Telehealth: Payer: Self-pay | Admitting: Family Medicine

## 2021-07-07 NOTE — Telephone Encounter (Signed)
Left message for patient to call office to make annual for May ?

## 2022-02-18 ENCOUNTER — Encounter: Payer: Self-pay | Admitting: Family Medicine

## 2022-02-18 ENCOUNTER — Ambulatory Visit: Payer: BC Managed Care – PPO | Admitting: Family Medicine

## 2022-02-18 VITALS — BP 140/80 | HR 99 | Temp 99.5°F | Ht 65.75 in | Wt 331.5 lb

## 2022-02-18 DIAGNOSIS — M5442 Lumbago with sciatica, left side: Secondary | ICD-10-CM | POA: Insufficient documentation

## 2022-02-18 MED ORDER — PREDNISONE 20 MG PO TABS: ORAL_TABLET | ORAL | 0 refills | Status: DC

## 2022-02-18 MED ORDER — CYCLOBENZAPRINE HCL 10 MG PO TABS: 10.0000 mg | ORAL_TABLET | Freq: Every evening | ORAL | 0 refills | Status: DC | PRN

## 2022-02-18 NOTE — Assessment & Plan Note (Signed)
Acute  No clear indication for X-ray today.  Complete prednisone taper.  Can use muscle relaxant at night as needed for spasm.  Start heat and low back stretches.  Cal if not improving in 2-4 weeks for further evaluation.

## 2022-02-18 NOTE — Progress Notes (Signed)
Patient ID: Tracey Cunningham, female    DOB: May 01, 1980, 41 y.o.   MRN: [id number]  This visit was conducted in person.  BP (!) 140/80   Pulse 99   Temp 99.5 F (37.5 C) (Oral)   Ht 5' 5.75" (1.67 m)   Wt (!) 331 lb 8 oz (150.4 kg)   LMP 01/24/2022   SpO2 98%   BMI 53.91 kg/m    CC:  Chief Complaint  Patient presents with   Back Pain    Since Monday-Ibuprofen and Heat not working    Subjective:   HPI: Tracey Cunningham is a 41 y.o. female patient of Dr. Lucretia Roers with history of hypertension and morbid obesity presenting on 02/18/2022 for Back Pain (Since Monday-Ibuprofen and Heat not working)  Onset low back pain, ongoing for 5 days.  No known trigger, no change in activity, no fall.  Pain is in  left lower back.. stabbing pain.. radiates to left lateral side.  Pain also in left  thigh, knee and left foot.  No new numbness, no weakness.  No new fever.   No perineal numbness.  No  change in incontinence She has tried to treat with ibuprofen 800 mg  and heating pad but it is not helping with pain.   No history of back surgery.. has had some low back pain in past remotely.      Relevant past medical, surgical, family and social history reviewed and updated as indicated. Interim medical history since our last visit reviewed. Allergies and medications reviewed and updated. Outpatient Medications Prior to Visit  Medication Sig Dispense Refill   famotidine (PEPCID) 20 MG tablet Take 20 mg by mouth as needed.     benzonatate (TESSALON) 200 MG capsule Take 1 capsule (200 mg total) by mouth 3 (three) times daily as needed. Do not bite pill, swallow it whole 30 capsule 1   No facility-administered medications prior to visit.     Per HPI unless specifically indicated in ROS section below Review of Systems  Constitutional:  Negative for fatigue and fever.  HENT:  Negative for congestion.   Eyes:  Negative for pain.  Respiratory:  Negative for cough and shortness of breath.    Cardiovascular:  Negative for chest pain, palpitations and leg swelling.  Gastrointestinal:  Negative for abdominal pain.  Genitourinary:  Negative for dysuria and vaginal bleeding.  Musculoskeletal:  Negative for back pain.  Neurological:  Negative for syncope, light-headedness and headaches.  Psychiatric/Behavioral:  Negative for dysphoric mood.    Objective:  BP (!) 140/80   Pulse 99   Temp 99.5 F (37.5 C) (Oral)   Ht 5' 5.75" (1.67 m)   Wt (!) 331 lb 8 oz (150.4 kg)   LMP 01/24/2022   SpO2 98%   BMI 53.91 kg/m   Wt Readings from Last 3 Encounters:  02/18/22 (!) 331 lb 8 oz (150.4 kg)  06/09/20 (!) 324 lb 7 oz (147.2 kg)  04/10/20 (!) 327 lb 4 oz (148.4 kg)      Physical Exam Constitutional:      General: She is not in acute distress.    Appearance: Normal appearance. She is well-developed. She is not ill-appearing or toxic-appearing.  HENT:     Head: Normocephalic.     Right Ear: Hearing, tympanic membrane, ear canal and external ear normal. Tympanic membrane is not erythematous, retracted or bulging.     Left Ear: Hearing, tympanic membrane, ear canal and external ear normal. Tympanic membrane  is not erythematous, retracted or bulging.     Nose: No mucosal edema or rhinorrhea.     Right Sinus: No maxillary sinus tenderness or frontal sinus tenderness.     Left Sinus: No maxillary sinus tenderness or frontal sinus tenderness.     Mouth/Throat:     Pharynx: Uvula midline.  Eyes:     General: Lids are normal. Lids are everted, no foreign bodies appreciated.     Conjunctiva/sclera: Conjunctivae normal.     Pupils: Pupils are equal, round, and reactive to light.  Neck:     Thyroid: No thyroid mass or thyromegaly.     Vascular: No carotid bruit.     Trachea: Trachea normal.  Cardiovascular:     Rate and Rhythm: Normal rate and regular rhythm.     Pulses: Normal pulses.     Heart sounds: Normal heart sounds, S1 normal and S2 normal. No murmur heard.    No friction  rub. No gallop.  Pulmonary:     Effort: Pulmonary effort is normal. No tachypnea or respiratory distress.     Breath sounds: Normal breath sounds. No decreased breath sounds, wheezing, rhonchi or rales.  Abdominal:     General: Bowel sounds are normal.     Palpations: Abdomen is soft.     Tenderness: There is no abdominal tenderness.  Musculoskeletal:     Cervical back: Normal range of motion and neck supple.     Lumbar back: Spasms and tenderness present. No bony tenderness. Decreased range of motion. Positive left straight leg raise test.  Skin:    General: Skin is warm and dry.     Findings: No rash.  Neurological:     Mental Status: She is alert.  Psychiatric:        Mood and Affect: Mood is not anxious or depressed.        Speech: Speech normal.        Behavior: Behavior normal. Behavior is cooperative.        Thought Content: Thought content normal.        Judgment: Judgment normal.       Results for orders placed or performed in visit on 09/29/20  Hepatic function panel  Result Value Ref Range   Total Bilirubin 0.5 0.2 - 1.2 mg/dL   Bilirubin, Direct 0.1 0.0 - 0.3 mg/dL   Alkaline Phosphatase 77 39 - 117 U/L   AST 108 (H) 0 - 37 U/L   ALT 167 (H) 0 - 35 U/L   Total Protein 6.9 6.0 - 8.3 g/dL   Albumin 4.1 3.5 - 5.2 g/dL  Basic metabolic panel  Result Value Ref Range   Sodium 138 135 - 145 mEq/L   Potassium 4.5 3.5 - 5.1 mEq/L   Chloride 107 96 - 112 mEq/L   CO2 23 19 - 32 mEq/L   Glucose, Bld 103 (H) 70 - 99 mg/dL   BUN 10 6 - 23 mg/dL   Creatinine, Ser 8.14 0.40 - 1.20 mg/dL   GFR 48.18 >56.31 mL/min   Calcium 8.8 8.4 - 10.5 mg/dL     COVID 19 screen:  No recent travel or known exposure to COVID19 The patient denies respiratory symptoms of COVID 19 at this time. The importance of social distancing was discussed today.   Assessment and Plan    Problem List Items Addressed This Visit     Acute left-sided low back pain with left-sided sciatica - Primary     Acute  No clear indication  for X-ray today.  Complete prednisone taper.  Can use muscle relaxant at night as needed for spasm.  Start heat and low back stretches.  Cal if not improving in 2-4 weeks for further evaluation.      Relevant Medications   predniSONE (DELTASONE) 20 MG tablet   cyclobenzaprine (FLEXERIL) 10 MG tablet   Meds ordered this encounter  Medications   predniSONE (DELTASONE) 20 MG tablet    Sig: 3 tabs by mouth daily x 3 days, then 2 tabs by mouth daily x 2 days then 1 tab by mouth daily x 2 days    Dispense:  15 tablet    Refill:  0   cyclobenzaprine (FLEXERIL) 10 MG tablet    Sig: Take 1 tablet (10 mg total) by mouth at bedtime as needed for muscle spasms.    Dispense:  15 tablet    Refill:  0     Eliezer Lofts, MD

## 2022-02-18 NOTE — Patient Instructions (Signed)
Complete prednisone taper.  Can use muscle relaxant at night as needed for spasm.  Start heat and low back stretches.  Cal if not improving in 2-4 weeks for further evaluation.

## 2022-02-21 ENCOUNTER — Ambulatory Visit: Payer: BC Managed Care – PPO | Admitting: Family Medicine

## 2022-03-18 ENCOUNTER — Ambulatory Visit (INDEPENDENT_AMBULATORY_CARE_PROVIDER_SITE_OTHER)
Admission: RE | Admit: 2022-03-18 | Discharge: 2022-03-18 | Disposition: A | Discharge status: Home / Self Care | Payer: BC Managed Care – PPO | Source: Ambulatory Visit | Attending: Family Medicine | Admitting: Family Medicine

## 2022-03-18 ENCOUNTER — Ambulatory Visit: Payer: BC Managed Care – PPO | Admitting: Family Medicine

## 2022-03-18 VITALS — BP 148/78 | HR 104 | Temp 98.6°F | Resp 16 | Ht 65.75 in | Wt 341.5 lb

## 2022-03-18 DIAGNOSIS — M5442 Lumbago with sciatica, left side: Secondary | ICD-10-CM

## 2022-03-18 DIAGNOSIS — M545 Low back pain, unspecified: Secondary | ICD-10-CM | POA: Diagnosis not present

## 2022-03-18 NOTE — Progress Notes (Signed)
Patient ID: Tracey Cunningham, female    DOB: 1980-10-12, 42 y.o.   MRN: [id number]  This visit was conducted in person.  BP (!) 148/78   Pulse (!) 104   Temp 98.6 F (37 C)   Resp 16   Ht 5' 5.75" (1.67 m)   Wt (!) 341 lb 8 oz (154.9 kg)   LMP 02/28/2022 (Approximate)   SpO2 98%   BMI 55.54 kg/m    CC:  Chief Complaint  Patient presents with   Back Pain    Lower back pain left side    Subjective:   HPI: Tracey Cunningham is a 42 y.o. female patient of Dr. Marliss Coots with history of hypertension and morbid obesity presenting on 03/18/2022 for Back Pain (Lower back pain left side)  Reviewed office visit note when I saw the patient for similar issue on February 18, 2022. At that point patient was diagnosed with acute left-sided low back pain with left-sided sciatica.  There is no clear indication for an x-ray.  I treated her with a prednisone taper as well as muscle relaxant as needed.  She reports today that she has continued back pain.  She report she was unable to take muscle relaxant given SE.  Minimal improvement.Marland Kitchen episodes of low back pain 3 days per week..  pain presents for several hours improves with ibuprofen 800 mg .   Radiation pain in to left leg.  No fever,  no perineal numbness. No weakness , no numbness in left leg.   Relevant past medical, surgical, family and social history reviewed and updated as indicated. Interim medical history since our last visit reviewed. Allergies and medications reviewed and updated. Outpatient Medications Prior to Visit  Medication Sig Dispense Refill   famotidine (PEPCID) 20 MG tablet Take 20 mg by mouth as needed.     cyclobenzaprine (FLEXERIL) 10 MG tablet Take 1 tablet (10 mg total) by mouth at bedtime as needed for muscle spasms. (Patient not taking: Reported on 03/18/2022) 15 tablet 0   predniSONE (DELTASONE) 20 MG tablet 3 tabs by mouth daily x 3 days, then 2 tabs by mouth daily x 2 days then 1 tab by mouth daily x 2 days 15  tablet 0   No facility-administered medications prior to visit.     Per HPI unless specifically indicated in ROS section below Review of Systems  Constitutional:  Negative for fatigue and fever.  HENT:  Negative for congestion.   Eyes:  Negative for pain.  Respiratory:  Negative for cough and shortness of breath.   Cardiovascular:  Negative for chest pain, palpitations and leg swelling.  Gastrointestinal:  Negative for abdominal pain.  Genitourinary:  Negative for dysuria and vaginal bleeding.  Musculoskeletal:  Positive for back pain.  Neurological:  Negative for syncope, light-headedness and headaches.  Psychiatric/Behavioral:  Negative for dysphoric mood.    Objective:  BP (!) 148/78   Pulse (!) 104   Temp 98.6 F (37 C)   Resp 16   Ht 5' 5.75" (1.67 m)   Wt (!) 341 lb 8 oz (154.9 kg)   LMP 02/28/2022 (Approximate)   SpO2 98%   BMI 55.54 kg/m   Wt Readings from Last 3 Encounters:  03/18/22 (!) 341 lb 8 oz (154.9 kg)  02/18/22 (!) 331 lb 8 oz (150.4 kg)  06/09/20 (!) 324 lb 7 oz (147.2 kg)      Physical Exam Constitutional:      General: She is not in acute  distress.    Appearance: Normal appearance. She is well-developed. She is not ill-appearing or toxic-appearing.  HENT:     Head: Normocephalic.     Right Ear: Hearing, tympanic membrane, ear canal and external ear normal. Tympanic membrane is not erythematous, retracted or bulging.     Left Ear: Hearing, tympanic membrane, ear canal and external ear normal. Tympanic membrane is not erythematous, retracted or bulging.     Nose: No mucosal edema or rhinorrhea.     Right Sinus: No maxillary sinus tenderness or frontal sinus tenderness.     Left Sinus: No maxillary sinus tenderness or frontal sinus tenderness.     Mouth/Throat:     Pharynx: Uvula midline.  Eyes:     General: Lids are normal. Lids are everted, no foreign bodies appreciated.     Conjunctiva/sclera: Conjunctivae normal.     Pupils: Pupils are equal,  round, and reactive to light.  Neck:     Thyroid: No thyroid mass or thyromegaly.     Vascular: No carotid bruit.     Trachea: Trachea normal.  Cardiovascular:     Rate and Rhythm: Normal rate and regular rhythm.     Pulses: Normal pulses.     Heart sounds: Normal heart sounds, S1 normal and S2 normal. No murmur heard.    No friction rub. No gallop.  Pulmonary:     Effort: Pulmonary effort is normal. No tachypnea or respiratory distress.     Breath sounds: Normal breath sounds. No decreased breath sounds, wheezing, rhonchi or rales.  Abdominal:     General: Bowel sounds are normal.     Palpations: Abdomen is soft.     Tenderness: There is no abdominal tenderness.  Musculoskeletal:     Cervical back: Normal range of motion and neck supple.     Lumbar back: Tenderness and bony tenderness present. Decreased range of motion. Negative right straight leg raise test and negative left straight leg raise test.  Skin:    General: Skin is warm and dry.     Findings: No rash.  Neurological:     Mental Status: She is alert.  Psychiatric:        Mood and Affect: Mood is not anxious or depressed.        Speech: Speech normal.        Behavior: Behavior normal. Behavior is cooperative.        Thought Content: Thought content normal.        Judgment: Judgment normal.       Results for orders placed or performed in visit on 09/29/20  Hepatic function panel  Result Value Ref Range   Total Bilirubin 0.5 0.2 - 1.2 mg/dL   Bilirubin, Direct 0.1 0.0 - 0.3 mg/dL   Alkaline Phosphatase 77 39 - 117 U/L   AST 108 (H) 0 - 37 U/L   ALT 167 (H) 0 - 35 U/L   Total Protein 6.9 6.0 - 8.3 g/dL   Albumin 4.1 3.5 - 5.2 g/dL  Basic metabolic panel  Result Value Ref Range   Sodium 138 135 - 145 mEq/L   Potassium 4.5 3.5 - 5.1 mEq/L   Chloride 107 96 - 112 mEq/L   CO2 23 19 - 32 mEq/L   Glucose, Bld 103 (H) 70 - 99 mg/dL   BUN 10 6 - 23 mg/dL   Creatinine, Ser 0.79 0.40 - 1.20 mg/dL   GFR 93.84 >60.00  mL/min   Calcium 8.8 8.4 - 10.5 mg/dL  COVID 19 screen:  No recent travel or known exposure to COVID19 The patient denies respiratory symptoms of COVID 19 at this time. The importance of social distancing was discussed today.   Assessment and Plan Problem List Items Addressed This Visit     Acute left-sided low back pain with left-sided sciatica - Primary     Acute flare of chronic issue.   Minimal improvement with prednisone taper.  Will eval with X-ray and move forward with referral to formal PT.  Encouraged weight loss to decrease strain on back.  She can use ibuprofen 800 mg every 8 hours as needed for apin.      Relevant Orders   DG Lumbar Spine Complete   Ambulatory referral to Physical Therapy       Kerby Nora, MD

## 2022-03-18 NOTE — Assessment & Plan Note (Signed)
Acute flare of chronic issue.   Minimal improvement with prednisone taper.  Will eval with X-ray and move forward with referral to formal PT.  Encouraged weight loss to decrease strain on back.  She can use ibuprofen 800 mg every 8 hours as needed for apin.

## 2022-03-18 NOTE — Patient Instructions (Signed)
We will call with X-ray results.  Continue ibuprofen 800 mg three times a day. Move forward with referral to PT.

## 2022-05-09 ENCOUNTER — Other Ambulatory Visit: Payer: Self-pay | Admitting: Family Medicine

## 2022-05-09 DIAGNOSIS — Z1231 Encounter for screening mammogram for malignant neoplasm of breast: Secondary | ICD-10-CM

## 2022-05-11 ENCOUNTER — Telehealth: Payer: Self-pay | Admitting: Physical Therapy

## 2022-05-11 NOTE — Telephone Encounter (Signed)
Called pt to inquire if she wanted to be moved ahead in the schedule. She did not pickup, so left VM instructing pt to call back.

## 2022-05-12 ENCOUNTER — Ambulatory Visit
Admission: RE | Admit: 2022-05-12 | Discharge: 2022-05-12 | Disposition: A | Discharge status: Home / Self Care | Payer: 59 | Source: Ambulatory Visit | Attending: Family Medicine | Admitting: Family Medicine

## 2022-05-12 DIAGNOSIS — Z1231 Encounter for screening mammogram for malignant neoplasm of breast: Secondary | ICD-10-CM | POA: Diagnosis present

## 2022-05-18 ENCOUNTER — Ambulatory Visit: Payer: 59 | Attending: Family Medicine | Admitting: Physical Therapy

## 2022-05-18 ENCOUNTER — Other Ambulatory Visit: Payer: Self-pay

## 2022-05-18 DIAGNOSIS — M5442 Lumbago with sciatica, left side: Secondary | ICD-10-CM | POA: Diagnosis not present

## 2022-05-18 DIAGNOSIS — M5459 Other low back pain: Secondary | ICD-10-CM | POA: Diagnosis present

## 2022-05-18 DIAGNOSIS — G8929 Other chronic pain: Secondary | ICD-10-CM

## 2022-05-18 DIAGNOSIS — M5440 Lumbago with sciatica, unspecified side: Secondary | ICD-10-CM | POA: Insufficient documentation

## 2022-05-18 NOTE — Therapy (Signed)
OUTPATIENT PHYSICAL THERAPY THORACOLUMBAR EVALUATION   Patient Name: Tracey Cunningham MRN: [id number] DOB:Sep 30, 1980, 42 y.o., female Today's Date: 05/18/2022  END OF SESSION:  PT End of Session - 05/18/22 1144     Visit Number 1    Number of Visits 20    Date for PT Re-Evaluation 07/27/22    Authorization Type U[employer] 2024    Authorization Time Period 23 VISITS PER YEAR    Authorization - Visit Number 1    Authorization - Number of Visits 23    Progress Note Due on Visit 10    PT Start Time 0845    PT Stop Time 0930    PT Time Calculation (min) 45 min    Activity Tolerance Patient tolerated treatment well    Behavior During Therapy [facility] for tasks assessed/performed             Past Medical History:  Diagnosis Date   Anemia    iron deficiency    Cerumen impaction    Family history of breast cancer    5/21 cancer genetic testing letter sent   Hypertension    borderline   Irregular periods    OBGYN- west side    Obesity    weight loss with diet and exercise    Past Surgical History:  Procedure Laterality Date   APPENDECTOMY  10/2005   CHOLECYSTECTOMY     foot fracture     right foot    Patient Active Problem List   Diagnosis Date Noted   Acute left-sided low back pain with left-sided sciatica 02/18/2022   Liver lesion 09/28/2020   Pulmonary nodule 08/23/2020   Lower abdominal pain 05/08/2020   Left groin pain 04/10/2020   Elevated liver transaminase level 01/01/2019   Lump on finger, right 10/10/2018   Family history of breast cancer in mother 02/02/2016   GERD (gastroesophageal reflux disease) 11/05/2014   Routine gynecological examination 01/31/2012   Routine general medical examination at a health care facility 01/31/2012   Cerumen impaction 05/14/2009   IRRITABLE BOWEL SYNDROME 04/23/2008   Morbid obesity ([place]) 04/19/2007   ANEMIA-IRON DEFICIENCY 08/24/2006   Essential hypertension 08/24/2006   MENORRHALGIA 08/24/2006    PCP: Dr. Loura Pardon    REFERRING PROVIDER: Dr. Eliezer Lofts   REFERRING DIAG: Left sided low back pain   Rationale for Evaluation and Treatment: Rehabilitation  THERAPY DIAG:  Other low back pain - Plan: PT plan of care cert/re-cert  Chronic left-sided low back pain with sciatica, sciatica laterality unspecified - Plan: PT plan of care cert/re-cert  ONSET DATE: [id number]  SUBJECTIVE:  SUBJECTIVE STATEMENT: See pertinent history   PERTINENT HISTORY:  Pt reports starting experiencing left sided low back pain about 3 years ago with an insidious onset. She reports pain that spreads from left side of back down to left hip and left groin and to medial side of ankle. She did physical therapy several years ago which did help relieve her symptoms. Patient has trouble standing for long periods of time and walking for long periods of time. She wants to be able to take her niece and nephew to aquarium, but cannot tolerate how much walking she has to do.   PAIN:  Are you having pain? Yes: NPRS scale: 9/10 Pain location: Left groin, left hip Pain description: Throbbing  Aggravating factors: Standing for long period of time  Relieving factors: Laying down, ibuprofen   PRECAUTIONS: None  WEIGHT BEARING RESTRICTIONS: No  FALLS:  Has patient fallen in last 6 months? No  LIVING ENVIRONMENT: Lives with: lives with their family and Roommate Lives in: House/apartment Stairs: Yes: External: 3 steps; on right going up and on left going up Has following equipment at home: None  OCCUPATION: Desk work   PLOF: Independent  PATIENT GOALS: Pt reports that she would not like to be in pain and be able to stand for longer periods of time. She wants to be able to go to aquarium with nieces and nephews.   NEXT MD VISIT: No visit scheduled    OBJECTIVE:   VITALS: BP146/89 HR 103 SpO2 98  DIAGNOSTIC FINDINGS:  CLINICAL DATA:  left sided low back pain with sciatica   EXAM: LUMBAR SPINE - COMPLETE 4+ VIEW   COMPARISON:  CT 05/26/2020   FINDINGS: There are 5 non-rib-bearing lumbar vertebrae. No evidence of lumbar spine fracture. There is mild-to-moderate degenerative disc disease, most prominent at L3-L4, L4-L5, and L5-S1. There is mild lower lumbar predominant facet arthropathy. Alignment is normal.   IMPRESSION: Mild to moderate multilevel degenerative disc disease, most prominent at L3-L4, L4-L5, and L5-S1.   Mild lower lumbar predominant facet arthropathy.     Electronically Signed   By: Maurine Simmering M.D.   On: 03/20/2022 10:26    PATIENT SURVEYS:  FOTO 46/100 with target of 63   SCREENING FOR RED FLAGS: Bowel or bladder incontinence: No Spinal tumors: No Cauda equina syndrome: No Compression fracture: No Abdominal aneurysm: No  COGNITION: Overall cognitive status: Within functional limits for tasks assessed     SENSATION: Light touch: Impaired   MUSCLE LENGTH: Hamstrings: Right 90 deg; Left 90 deg Thomas test: Positive bilateral  Ely's Test: Positive Bilateral  Ober's Test: Negative Bilateral   POSTURE: No Significant postural limitations  PALPATION: Left sided L4 UPA TTP   LUMBAR ROM:   AROM eval  Flexion 100%  Extension 100%*  Right lateral flexion 100%  Left lateral flexion 100%  Right rotation 100%  Left rotation 100%   (Blank rows = not tested) *=Painful   LOWER EXTREMITY ROM:     Active  Right eval Left eval  Hip flexion    Hip extension    Hip abduction    Hip adduction    Hip internal rotation    Hip external rotation    Knee flexion    Knee extension    Ankle dorsiflexion    Ankle plantarflexion    Ankle inversion    Ankle eversion     (Blank rows = not tested)  LOWER EXTREMITY MMT:    MMT Right eval Left eval  Hip  flexion 4+ 4+  Hip extension 4- 4-   Hip abduction 4 4  Hip adduction 4- 4-  Hip internal rotation    Hip external rotation    Knee flexion 4 4  Knee extension 4- 4-  Ankle dorsiflexion 5 5  Ankle plantarflexion    Ankle inversion    Ankle eversion     (Blank rows = not tested)  LUMBAR SPECIAL TESTS:  Straight leg raise test: Negative, Slump test: Negative, FABER test: Positive, Thomas test: Positive, and FADIR test: Positive Left   FUNCTIONAL TESTS:  Squat- NT   GAIT: Distance walked: 50 ft  Assistive device utilized: None Level of assistance: Complete Independence Comments: No gait deficits noted   TODAY'S TREATMENT:                                                                                                                              DATE:   05/18/22:  Lower trunk rotation 3 x 10  Modified thomas stretch 2 x 60 sec  Prone Quad Stretch 3 x 30 sec    PATIENT EDUCATION:  Education details: form and technique for appropriate exercise and explanation about underlying lumbar spine pathology  Person educated: Patient Education method: Consulting civil engineer, Demonstration, Verbal cues, and Handouts Education comprehension: verbalized understanding, returned demonstration, and verbal cues required  HOME EXERCISE PROGRAM: Access Code: [email or web address] URL: [email or web address] Date: 05/18/2022 Prepared by: Bradly Chris  Exercises - Prone Quadriceps Stretch with Strap  - 1 x daily - 3 reps - 30-60 sec hold - Supine Lower Trunk Rotation  - 1 x daily - 3 sets - 10 reps - Supine Bridge  - 3 x weekly - 3 sets - 10 reps - Modified Thomas Stretch  - 1 x daily - 3 reps - 30-60 sec  hold  ASSESSMENT:  CLINICAL IMPRESSION: Patient is a 42 y.o. white female who was seen today for physical therapy evaluation and treatment for chronic left sided low back pain. Despite reporting radiating unilateral pain, lumbar radiculopathy ruled out during examination with negative straight leg raise and slump test. She shows  signs and symptoms that place her in the movement control treatment based group with moderate pain, stable symptoms, and low disability.  Pt does demonstrate L4-L5 nerve impingement dermatomal pattern with numbness and tingling along medial side of thigh that extends down to medial ankle. She demonstrates decreased hip flexibility and strength and increased pain with lumbar extension and prolonged standing. She will benefit from skilled PT to improve hip strength and flexibility and to reduce low back pain and radicular symptoms to ambulate longer distances and stand for longer periods of time to care for her nieces and nephew and perform ADLs.   OBJECTIVE IMPAIRMENTS: decreased strength, impaired flexibility, impaired sensation, obesity, and pain.   ACTIVITY LIMITATIONS: carrying, lifting, bending, sitting, standing, sleeping, and locomotion level  PARTICIPATION LIMITATIONS: shopping, community activity, occupation, and child care  PERSONAL FACTORS: Fitness and Time since onset  of injury/illness/exacerbation are also affecting patient's functional outcome.   REHAB POTENTIAL: Good  CLINICAL DECISION MAKING: Stable/uncomplicated  EVALUATION COMPLEXITY: Low   GOALS: Goals reviewed with patient? No  SHORT TERM GOALS: Target date: 06/01/2022  Pt will be independent with HEP in order to improve strength and balance in order to decrease fall risk and improve function at home and work. Baseline: NT  Goal status: INITIAL    LONG TERM GOALS: Target date: 07/27/2022  Patient will have improved function and activity level as evidenced by an increase in FOTO score by 10 points or more.  Baseline: 46/100 Goal status: INITIAL  2.  Patient will improve hip strength by 1/3 MMT (ie 4- to 4) to offload lumbar spinal structures and to increase lumbar stability for improved lumbar pain and symptom relief.  Baseline: Hip Ext R/L 4-/4-, Hip Abd R/L 4/4, Hip Add R/L 4-/4-  Goal status: INITIAL  3.   Patient will be able to maintain a neutral spine while performing >=10 mini-squats as evidence of good abdominal strength for increased spinal stability.  Baseline: NT  Goal status: INITIAL    PLAN:  PT FREQUENCY: 1-2x/week  PT DURATION: 10 weeks  PLANNED INTERVENTIONS: Therapeutic exercises, Neuromuscular re-education, Gait training, Patient/Family education, Self Care, Joint mobilization, Joint manipulation, Aquatic Therapy, Dry Needling, Electrical stimulation, Spinal manipulation, Spinal mobilization, Cryotherapy, Moist heat, Ultrasound, Manual therapy, and Re-evaluation.  PLAN FOR NEXT SESSION: Abdominal strength testing, progress hip strengthening and flexibility exercises, educate patient on nerve glides.  Bradly Chris PT, DPT  05/18/2022, 11:46 AM

## 2022-05-24 ENCOUNTER — Ambulatory Visit: Payer: 59 | Admitting: Physical Therapy

## 2022-05-24 DIAGNOSIS — M5459 Other low back pain: Secondary | ICD-10-CM

## 2022-05-24 DIAGNOSIS — G8929 Other chronic pain: Secondary | ICD-10-CM

## 2022-05-24 NOTE — Therapy (Signed)
OUTPATIENT PHYSICAL THERAPY TREATMENT NOTE   Patient Name: Tracey Cunningham MRN: [id number] DOB:1981-02-15, 42 y.o., female Today's Date: 05/24/2022  PCP: Dr. Loura Pardon  REFERRING PROVIDER: Dr. Eliezer Lofts   END OF SESSION:   PT End of Session - 05/24/22 1156     Visit Number 2    Number of Visits 20    Date for PT Re-Evaluation 07/27/22    Authorization Type UHC 2024    Authorization Time Period 23 VISITS PER YEAR    Authorization - Visit Number 2    Authorization - Number of Visits 23    Progress Note Due on Visit 10    PT Start Time 1150    PT Stop Time 1230    PT Time Calculation (min) 40 min    Activity Tolerance Patient tolerated treatment well    Behavior During Therapy [facility] for tasks assessed/performed             Past Medical History:  Diagnosis Date   Anemia    iron deficiency    Cerumen impaction    Family history of breast cancer    5/21 cancer genetic testing letter sent   Hypertension    borderline   Irregular periods    OBGYN- west side    Obesity    weight loss with diet and exercise    Past Surgical History:  Procedure Laterality Date   APPENDECTOMY  10/2005   CHOLECYSTECTOMY     foot fracture     right foot    Patient Active Problem List   Diagnosis Date Noted   Acute left-sided low back pain with left-sided sciatica 02/18/2022   Liver lesion 09/28/2020   Pulmonary nodule 08/23/2020   Lower abdominal pain 05/08/2020   Left groin pain 04/10/2020   Elevated liver transaminase level 01/01/2019   Lump on finger, right 10/10/2018   Family history of breast cancer in mother 02/02/2016   GERD (gastroesophageal reflux disease) 11/05/2014   Routine gynecological examination 01/31/2012   Routine general medical examination at a health care facility 01/31/2012   Cerumen impaction 05/14/2009   IRRITABLE BOWEL SYNDROME 04/23/2008   Morbid obesity ([place]) 04/19/2007   ANEMIA-IRON DEFICIENCY 08/24/2006   Essential hypertension 08/24/2006    MENORRHALGIA 08/24/2006    REFERRING DIAG: Left sided low back pain   THERAPY DIAG:  Other low back pain  Chronic left-sided low back pain with sciatica, sciatica laterality unspecified  Rationale for Evaluation and Treatment Rehabilitation  PERTINENT HISTORY: Pt reports starting experiencing left sided low back pain about 3 years ago with an insidious onset. She reports pain that spreads from left side of back down to left hip and left groin and to medial side of ankle. She did physical therapy several years ago which did help relieve her symptoms. Patient has trouble standing for long periods of time and walking for long periods of time. She wants to be able to take her niece and nephew to aquarium, but cannot tolerate how much walking she has to do.   PRECAUTIONS: None   SUBJECTIVE:  SUBJECTIVE STATEMENT:  She experienced a pain flare on Sunday morning at 5 am that woke her up and it finally resolved. She also experienced pain from spring cleaning.    PAIN:  Are you having pain? Yes: NPRS scale: 1-2/10 Pain location: Left sided of L4-L5 vertebrae  Pain description: Achy  Aggravating factors: Walking and bending  Relieving factors: Ibuprofen and heating pad   OBJECTIVE: (objective measures completed at initial evaluation unless otherwise dated)  VITALS: BP146/89 HR 103 SpO2 98   DIAGNOSTIC FINDINGS:  CLINICAL DATA:  left sided low back pain with sciatica   EXAM: LUMBAR SPINE - COMPLETE 4+ VIEW   COMPARISON:  CT 05/26/2020   FINDINGS: There are 5 non-rib-bearing lumbar vertebrae. No evidence of lumbar spine fracture. There is mild-to-moderate degenerative disc disease, most prominent at L3-L4, L4-L5, and L5-S1. There is mild lower lumbar predominant facet arthropathy. Alignment is normal.    IMPRESSION: Mild to moderate multilevel degenerative disc disease, most prominent at L3-L4, L4-L5, and L5-S1.   Mild lower lumbar predominant facet arthropathy.     Electronically Signed   By: Maurine Simmering M.D.   On: 03/20/2022 10:26     PATIENT SURVEYS:  FOTO 46/100 with target of 63    SCREENING FOR RED FLAGS: Bowel or bladder incontinence: No Spinal tumors: No Cauda equina syndrome: No Compression fracture: No Abdominal aneurysm: No   COGNITION: Overall cognitive status: Within functional limits for tasks assessed                          SENSATION: Light touch: Impaired    MUSCLE LENGTH: Hamstrings: Right 90 deg; Left 90 deg Thomas test: Positive bilateral  Ely's Test: Positive Bilateral  Ober's Test: Negative Bilateral    POSTURE: No Significant postural limitations   PALPATION: Left sided L4 UPA TTP    LUMBAR ROM:    AROM eval  Flexion 100%  Extension 100%*  Right lateral flexion 100%  Left lateral flexion 100%  Right rotation 100%  Left rotation 100%   (Blank rows = not tested) *=Painful    LOWER EXTREMITY ROM:      Active  Right eval Left eval  Hip flexion      Hip extension      Hip abduction      Hip adduction      Hip internal rotation      Hip external rotation      Knee flexion      Knee extension      Ankle dorsiflexion      Ankle plantarflexion      Ankle inversion      Ankle eversion       (Blank rows = not tested)   LOWER EXTREMITY MMT:     MMT Right eval Left eval  Hip flexion 4+ 4+  Hip extension 4- 4-  Hip abduction 4 4  Hip adduction 4- 4-  Hip internal rotation      Hip external rotation      Knee flexion 4 4  Knee extension 4- 4-  Ankle dorsiflexion 5 5  Ankle plantarflexion      Ankle inversion      Ankle eversion       (Blank rows = not tested)   LUMBAR SPECIAL TESTS:  Straight leg raise test: Negative, Slump test: Negative, FABER test: Positive, Thomas test: Positive, and FADIR test: Positive Left     FUNCTIONAL TESTS:  Squat- NT  GAIT: Distance walked: 50 ft  Assistive device utilized: None Level of assistance: Complete Independence Comments: No gait deficits noted    TODAY'S TREATMENT:                                                                                                                              DATE:    05/24/22: Matrix Recumbent Bicycle 2 min  -Pt reports increased bilateral knee pain  Nu-Step with seat and arms at 10 with resistance at 2 for 5 min  Seated Sciatic Tensioner on LLE 1 x 10  Abdominal Strength 4+ Good 4+ Good + Pelvic tilt & lower an extended leg 04 Good Pelvic tilt & lower an extended leg 15 Abdominal Strength 4-= Pelvic tilt lower and extend legs to 30 degrees  -Pt able to perform without raising low back  Pelvic Tilt 1 x 10 with 3 sec hold  -min VC to perform breathing holds during tightening of abs  Side Lying Clam Shell 1 x 10  Side Lying Clam Shell 1 x 10 with red band  Side Lying Clam Shell 1 x 10 with green band  Side Lying Clam Shell 2 x 10 with blue band    05/18/22:  Lower trunk rotation 3 x 10  Modified thomas stretch 2 x 60 sec  Prone Quad Stretch 3 x 30 sec      PATIENT EDUCATION:  Education details: form and technique for appropriate exercise and explanation about underlying lumbar spine pathology  Person educated: Patient Education method: Consulting civil engineer, Demonstration, Verbal cues, and Handouts Education comprehension: verbalized understanding, returned demonstration, and verbal cues required   HOME EXERCISE PROGRAM: Access Code: [email or web address] URL: [email or web address] Date: 05/24/2022 Prepared by: Bradly Chris  Exercises - Prone Quadriceps Stretch with Strap  - 1 x daily - 3 reps - 30-60 sec hold - Supine Lower Trunk Rotation  - 1 x daily - 3 sets - 10 reps - Supine Bridge  - 3 x weekly - 3 sets - 10 reps - Modified Thomas Stretch  - 1 x daily - 3 reps - 30-60 sec  hold - Seated Sciatic Tensioner  - 1 x  daily - 1 sets - 10 reps - Supine Posterior Pelvic Tilt  - 3 x weekly - 3 sets - 10 reps - 3 sec  hold - Clamshell  - 3 x weekly - 3 sets - 10 reps   ASSESSMENT:   CLINICAL IMPRESSION: Pt presents for f/u for left sided low back pain with radicular symptoms. She shows decreased abdominal strength with lumbar extension for compensation. She was able to perform all hip strengthening exercises without an increase in her pain or symptoms. She will continue benefit from skilled PT to improve hip strength and flexibility and to reduce low back pain and radicular symptoms to ambulate longer distances and stand for longer periods of time to care for her nieces and nephew and perform ADLs.     OBJECTIVE IMPAIRMENTS:  decreased strength, impaired flexibility, impaired sensation, obesity, and pain.    ACTIVITY LIMITATIONS: carrying, lifting, bending, sitting, standing, sleeping, and locomotion level   PARTICIPATION LIMITATIONS: shopping, community activity, occupation, and child care   PERSONAL FACTORS: Fitness and Time since onset of injury/illness/exacerbation are also affecting patient's functional outcome.    REHAB POTENTIAL: Good   CLINICAL DECISION MAKING: Stable/uncomplicated   EVALUATION COMPLEXITY: Low     GOALS: Goals reviewed with patient? No   SHORT TERM GOALS: Target date: 06/01/2022   Pt will be independent with HEP in order to improve strength and balance in order to decrease fall risk and improve function at home and work. Baseline: Performing exercise program independently  Goal status: Ongoing        LONG TERM GOALS: Target date: 07/27/2022   Patient will have improved function and activity level as evidenced by an increase in FOTO score by 10 points or more.  Baseline: 46/100 Goal status: Ongoing    2.  Patient will improve hip strength by 1/3 MMT (ie 4- to 4) to offload lumbar spinal structures and to increase lumbar stability for improved lumbar pain and symptom  relief.  Baseline: Hip Ext R/L 4-/4-, Hip Abd R/L 4/4, Hip Add R/L 4-/4-  Goal status: Ongoing    3.  Patient will be able to maintain a neutral spine while performing >=10 mini-squats as evidence of good abdominal strength for increased spinal stability.  Baseline: Able to perform 10 mini-squats with a neutral spine  Goal status: Achieved   4. Patient will be able to perform a normal pelvic tilt and lower legs to 45, 30, 15 and 0 degrees without lumbar extension compensation as evidenced of improved abdominal strength.  Baseline: 4-; Able to lower to 30 and 0 degrees with good pelvic tilt  Goal status: Not Met        PLAN:   PT FREQUENCY: 1-2x/week   PT DURATION: 10 weeks   PLANNED INTERVENTIONS: Therapeutic exercises, Neuromuscular re-education, Gait training, Patient/Family education, Self Care, Joint mobilization, Joint manipulation, Aquatic Therapy, Dry Needling, Electrical stimulation, Spinal manipulation, Spinal mobilization, Cryotherapy, Moist heat, Ultrasound, Manual therapy, and Re-evaluation.   PLAN FOR NEXT SESSION: Continue to progress hip and abdominal strengthening without a provocation in her symptoms  Bradly Chris PT, DPT  05/24/2022, 11:58 AM

## 2022-05-26 ENCOUNTER — Ambulatory Visit: Payer: 59 | Admitting: Physical Therapy

## 2022-05-26 DIAGNOSIS — G8929 Other chronic pain: Secondary | ICD-10-CM

## 2022-05-26 DIAGNOSIS — M5459 Other low back pain: Secondary | ICD-10-CM | POA: Diagnosis not present

## 2022-05-26 NOTE — Therapy (Signed)
OUTPATIENT PHYSICAL THERAPY TREATMENT NOTE   Patient Name: Tracey Cunningham MRN: [id number] DOB:October 03, 1980, 42 y.o., female Today's Date: 05/26/2022  PCP: Dr. Loura Pardon  REFERRING PROVIDER: Dr. Eliezer Lofts   END OF SESSION:   PT End of Session - 05/26/22 1148     Visit Number 3    Number of Visits 20    Date for PT Re-Evaluation 07/27/22    Authorization Type [employer] 2024    Authorization Time Period 23 VISITS PER YEAR    Authorization - Visit Number 3    Authorization - Number of Visits 23    Progress Note Due on Visit 10    PT Start Time 1100    PT Stop Time 1145    PT Time Calculation (min) 45 min    Activity Tolerance Patient tolerated treatment well    Behavior During Therapy [facility] for tasks assessed/performed              Past Medical History:  Diagnosis Date   Anemia    iron deficiency    Cerumen impaction    Family history of breast cancer    5/21 cancer genetic testing letter sent   Hypertension    borderline   Irregular periods    OBGYN- [place] side    Obesity    weight loss with diet and exercise    Past Surgical History:  Procedure Laterality Date   APPENDECTOMY  10/2005   CHOLECYSTECTOMY     foot fracture     right foot    Patient Active Problem List   Diagnosis Date Noted   Acute left-sided low back pain with left-sided sciatica 02/18/2022   Liver lesion 09/28/2020   Pulmonary nodule 08/23/2020   Lower abdominal pain 05/08/2020   Left groin pain 04/10/2020   Elevated liver transaminase level 01/01/2019   Lump on finger, right 10/10/2018   Family history of breast cancer in mother 02/02/2016   GERD (gastroesophageal reflux disease) 11/05/2014   Routine gynecological examination 01/31/2012   Routine general medical examination at a health care facility 01/31/2012   Cerumen impaction 05/14/2009   IRRITABLE BOWEL SYNDROME 04/23/2008   Morbid obesity ([place]) 04/19/2007   ANEMIA-IRON DEFICIENCY 08/24/2006   Essential hypertension 08/24/2006    MENORRHALGIA 08/24/2006    REFERRING DIAG: Left sided low back pain   THERAPY DIAG:  Other low back pain  Chronic left-sided low back pain with sciatica, sciatica laterality unspecified  Rationale for Evaluation and Treatment Rehabilitation  PERTINENT HISTORY: Pt reports starting experiencing left sided low back pain about 3 years ago with an insidious onset. She reports pain that spreads from left side of back down to left hip and left groin and to medial side of ankle. She did physical therapy several years ago which did help relieve her symptoms. Patient has trouble standing for long periods of time and walking for long periods of time. She wants to be able to take her niece and nephew to aquarium, but cannot tolerate how much walking she has to do.   PRECAUTIONS: None   SUBJECTIVE:  SUBJECTIVE STATEMENT:  She states that she felt muscle soreness after last session, but she has not felt any symptoms.    PAIN:  Are you having pain? No   OBJECTIVE: (objective measures completed at initial evaluation unless otherwise dated)  VITALS: BP146/89 HR 103 SpO2 98   DIAGNOSTIC FINDINGS:  CLINICAL DATA:  left sided low back pain with sciatica   EXAM: LUMBAR SPINE - COMPLETE 4+ VIEW   COMPARISON:  CT 05/26/2020   FINDINGS: There are 5 non-rib-bearing lumbar vertebrae. No evidence of lumbar spine fracture. There is mild-to-moderate degenerative disc disease, most prominent at L3-L4, L4-L5, and L5-S1. There is mild lower lumbar predominant facet arthropathy. Alignment is normal.   IMPRESSION: Mild to moderate multilevel degenerative disc disease, most prominent at L3-L4, L4-L5, and L5-S1.   Mild lower lumbar predominant facet arthropathy.     Electronically Signed   By: Maurine Simmering M.D.   On:  03/20/2022 10:26     PATIENT SURVEYS:  FOTO 46/100 with target of 63    SCREENING FOR RED FLAGS: Bowel or bladder incontinence: No Spinal tumors: No Cauda equina syndrome: No Compression fracture: No Abdominal aneurysm: No   COGNITION: Overall cognitive status: Within functional limits for tasks assessed                          SENSATION: Light touch: Impaired    MUSCLE LENGTH: Hamstrings: Right 90 deg; Left 90 deg Thomas test: Positive bilateral  Ely's Test: Positive Bilateral  Ober's Test: Negative Bilateral    POSTURE: No Significant postural limitations   PALPATION: Left sided L4 UPA TTP    LUMBAR ROM:    AROM eval  Flexion 100%  Extension 100%*  Right lateral flexion 100%  Left lateral flexion 100%  Right rotation 100%  Left rotation 100%   (Blank rows = not tested) *=Painful    LOWER EXTREMITY ROM:      Active  Right eval Left eval  Hip flexion      Hip extension      Hip abduction      Hip adduction      Hip internal rotation      Hip external rotation      Knee flexion      Knee extension      Ankle dorsiflexion      Ankle plantarflexion      Ankle inversion      Ankle eversion       (Blank rows = not tested)   LOWER EXTREMITY MMT:     MMT Right eval Left eval  Hip flexion 4+ 4+  Hip extension 4- 4-  Hip abduction 4 4  Hip adduction 4- 4-  Hip internal rotation      Hip external rotation      Knee flexion 4 4  Knee extension 4- 4-  Ankle dorsiflexion 5 5  Ankle plantarflexion      Ankle inversion      Ankle eversion       (Blank rows = not tested)   LUMBAR SPECIAL TESTS:  Straight leg raise test: Negative, Slump test: Negative, FABER test: Positive, Thomas test: Positive, and FADIR test: Positive Left    FUNCTIONAL TESTS:  Squat- NT    GAIT: Distance walked: 50 ft  Assistive device utilized: None Level of assistance: Complete Independence Comments: No gait deficits noted    TODAY'S TREATMENT:  DATE:   05/26/22: Nu-Step at seat 10 with resistance 2- 5 min OMEGA Leg Press # 95 3 x 10  Standing Hip Abduction with BUE support 3 x 10  Mini-Squats 3 x 10  -min VC to space out feet and to tap buttocks on elevated table  Prone Quad Stretch 3 x 30 sec   05/24/22: Matrix Recumbent Bicycle 2 min  -Pt reports increased bilateral knee pain  Nu-Step with seat and arms at 10 with resistance at 2 for 5 min  Seated Sciatic Tensioner on LLE 1 x 10  Abdominal Strength 4+ Good 4+ Good + Pelvic tilt & lower an extended leg 04 Good Pelvic tilt & lower an extended leg 15 Abdominal Strength 4-= Pelvic tilt lower and extend legs to 30 degrees  -Pt able to perform without raising low back  Pelvic Tilt 1 x 10 with 3 sec hold  -min VC to perform breathing holds during tightening of abs  Side Lying Clam Shell 1 x 10  Side Lying Clam Shell 1 x 10 with red band  Side Lying Clam Shell 1 x 10 with green band  Side Lying Clam Shell 2 x 10 with blue band    05/18/22:  Lower trunk rotation 3 x 10  Modified thomas stretch 2 x 60 sec  Prone Quad Stretch 3 x 30 sec      PATIENT EDUCATION:  Education details: form and technique for appropriate exercise and explanation about underlying lumbar spine pathology  Person educated: Patient Education method: Consulting civil engineer, Demonstration, Verbal cues, and Handouts Education comprehension: verbalized understanding, returned demonstration, and verbal cues required   HOME EXERCISE PROGRAM: Access Code: [email or web address] URL: [email or web address] Date: 05/26/2022 Prepared by: Bradly Chris  Exercises - Seated Hip External Rotation Stretch  - 1 x daily - 3 reps - 60 sec  hold - Prone Quadriceps Stretch with Strap  - 1 x daily - 3 reps - 30-60 sec hold - Supine Lower Trunk Rotation  - 1 x daily - 3 sets - 10 reps - Modified Thomas Stretch  - 1 x daily - 3 reps  - 30-60 sec  hold - Seated Sciatic Tensioner  - 1 x daily - 1 sets - 10 reps - Supine Posterior Pelvic Tilt  - 3 x weekly - 3 sets - 10 reps - 3 sec  hold - Standing Hip Abduction with Counter Support  - 3 x weekly - 3 sets - 10 reps - Mini Squat  - 3 x weekly - 3 sets - 10 reps   ASSESSMENT:   CLINICAL IMPRESSION: Pt continues to show progress with hip strengthening with ability to tolerate hip abductor and glute strengthening progressions. She did not show any increase in her low back pain or radicular symptoms. She will continue benefit from skilled PT to improve hip strength and flexibility and to reduce low back pain and radicular symptoms to ambulate longer distances and stand for longer periods of time to care for her nieces and nephew and perform ADLs.     OBJECTIVE IMPAIRMENTS: decreased strength, impaired flexibility, impaired sensation, obesity, and pain.    ACTIVITY LIMITATIONS: carrying, lifting, bending, sitting, standing, sleeping, and locomotion level   PARTICIPATION LIMITATIONS: shopping, community activity, occupation, and child care   PERSONAL FACTORS: Fitness and Time since onset of injury/illness/exacerbation are also affecting patient's functional outcome.    REHAB POTENTIAL: Good   CLINICAL DECISION MAKING: Stable/uncomplicated   EVALUATION COMPLEXITY: Low     GOALS: Goals reviewed with patient? No  SHORT TERM GOALS: Target date: 06/01/2022   Pt will be independent with HEP in order to improve strength and balance in order to decrease fall risk and improve function at home and work. Baseline: Performing exercise program independently  Goal status: Ongoing        LONG TERM GOALS: Target date: 07/27/2022   Patient will have improved function and activity level as evidenced by an increase in FOTO score by 10 points or more.  Baseline: 46/100 Goal status: Ongoing    2.  Patient will improve hip strength by 1/3 MMT (ie 4- to 4) to offload lumbar spinal  structures and to increase lumbar stability for improved lumbar pain and symptom relief.  Baseline: Hip Ext R/L 4-/4-, Hip Abd R/L 4/4, Hip Add R/L 4-/4-  Goal status: Ongoing    3.  Patient will be able to maintain a neutral spine while performing >=10 mini-squats as evidence of good abdominal strength for increased spinal stability.  Baseline: Able to perform 10 mini-squats with a neutral spine  Goal status: Achieved   4. Patient will be able to perform a normal pelvic tilt and lower legs to 45, 30, 15 and 0 degrees without lumbar extension compensation as evidenced of improved abdominal strength.  Baseline: 4-; Able to lower to 30 and 0 degrees with good pelvic tilt  Goal status: Not Met        PLAN:   PT FREQUENCY: 1-2x/week   PT DURATION: 10 weeks   PLANNED INTERVENTIONS: Therapeutic exercises, Neuromuscular re-education, Gait training, Patient/Family education, Self Care, Joint mobilization, Joint manipulation, Aquatic Therapy, Dry Needling, Electrical stimulation, Spinal manipulation, Spinal mobilization, Cryotherapy, Moist heat, Ultrasound, Manual therapy, and Re-evaluation.   PLAN FOR NEXT SESSION: Continue to progress hip and abdominal strengthening without a provocation in her symptoms: hip abduction with less UE support, mini-squats with weight, etc  Bradly Chris PT, DPT  05/26/2022, 11:49 AM

## 2022-05-30 ENCOUNTER — Ambulatory Visit: Payer: 59 | Admitting: Physical Therapy

## 2022-05-30 ENCOUNTER — Encounter: Payer: Self-pay | Admitting: Physical Therapy

## 2022-05-30 DIAGNOSIS — G8929 Other chronic pain: Secondary | ICD-10-CM

## 2022-05-30 DIAGNOSIS — M5459 Other low back pain: Secondary | ICD-10-CM | POA: Diagnosis not present

## 2022-05-30 NOTE — Therapy (Signed)
OUTPATIENT PHYSICAL THERAPY TREATMENT NOTE   Patient Name: Tracey Cunningham MRN: [id number] DOB:May 10, 1980, 42 y.o., female Today's Date: 05/30/2022  PCP: Dr. Loura Pardon  REFERRING PROVIDER: Dr. Eliezer Lofts   END OF SESSION:   PT End of Session - 05/30/22 0940     Visit Number 4    Number of Visits 20    Date for PT Re-Evaluation 07/27/22    Authorization Type UHC 2024    Authorization Time Period 23 VISITS PER YEAR    Authorization - Visit Number 4    Authorization - Number of Visits 23    Progress Note Due on Visit 10    PT Start Time 0935    PT Stop Time 1015    PT Time Calculation (min) 40 min    Activity Tolerance Patient tolerated treatment well    Behavior During Therapy [facility] for tasks assessed/performed              Past Medical History:  Diagnosis Date   Anemia    iron deficiency    Cerumen impaction    Family history of breast cancer    5/21 cancer genetic testing letter sent   Hypertension    borderline   Irregular periods    OBGYN- [place] [facility]    Obesity    weight loss with diet and exercise    Past Surgical History:  Procedure Laterality Date   APPENDECTOMY  10/2005   CHOLECYSTECTOMY     foot fracture     right foot    Patient Active Problem List   Diagnosis Date Noted   Acute left-sided low back pain with left-sided sciatica 02/18/2022   Liver lesion 09/28/2020   Pulmonary nodule 08/23/2020   Lower abdominal pain 05/08/2020   Left groin pain 04/10/2020   Elevated liver transaminase level 01/01/2019   Lump on finger, right 10/10/2018   Family history of breast cancer in mother 02/02/2016   GERD (gastroesophageal reflux disease) 11/05/2014   Routine gynecological examination 01/31/2012   Routine general medical examination at a health care facility 01/31/2012   Cerumen impaction 05/14/2009   IRRITABLE BOWEL SYNDROME 04/23/2008   Morbid obesity ([place]) 04/19/2007   ANEMIA-IRON DEFICIENCY 08/24/2006   Essential hypertension 08/24/2006    MENORRHALGIA 08/24/2006    REFERRING DIAG: Left sided low back pain   THERAPY DIAG:  Other low back pain  Chronic left-sided low back pain with sciatica, sciatica laterality unspecified  Rationale for Evaluation and Treatment Rehabilitation  PERTINENT HISTORY: Pt reports starting experiencing left sided low back pain about 3 years ago with an insidious onset. She reports pain that spreads from left side of back down to left hip and left groin and to medial side of ankle. She did physical therapy several years ago which did help relieve her symptoms. Patient has trouble standing for long periods of time and walking for long periods of time. She wants to be able to take her niece and nephew to aquarium, but cannot tolerate how much walking she has to do.   PRECAUTIONS: None   SUBJECTIVE:  SUBJECTIVE STATEMENT:  Pt reports she felt back pain over weekend but she is not sure why she had a flare in her back pain. She was able to do all exercises without difficulty.    PAIN:  Are you having pain? No   OBJECTIVE: (objective measures completed at initial evaluation unless otherwise dated)  VITALS: BP146/89 HR 103 SpO2 98   DIAGNOSTIC FINDINGS:  CLINICAL DATA:  left sided low back pain with sciatica   EXAM: LUMBAR SPINE - COMPLETE 4+ VIEW   COMPARISON:  CT 05/26/2020   FINDINGS: There are 5 non-rib-bearing lumbar vertebrae. No evidence of lumbar spine fracture. There is mild-to-moderate degenerative disc disease, most prominent at L3-L4, L4-L5, and L5-S1. There is mild lower lumbar predominant facet arthropathy. Alignment is normal.   IMPRESSION: Mild to moderate multilevel degenerative disc disease, most prominent at L3-L4, L4-L5, and L5-S1.   Mild lower lumbar predominant facet arthropathy.      Electronically Signed   By: Maurine Simmering M.D.   On: 03/20/2022 10:26     PATIENT SURVEYS:  FOTO 46/100 with target of 63    SCREENING FOR RED FLAGS: Bowel or bladder incontinence: No Spinal tumors: No Cauda equina syndrome: No Compression fracture: No Abdominal aneurysm: No   COGNITION: Overall cognitive status: Within functional limits for tasks assessed                          SENSATION: Light touch: Impaired    MUSCLE LENGTH: Hamstrings: Right 90 deg; Left 90 deg Thomas test: Positive bilateral  Ely's Test: Positive Bilateral  Ober's Test: Negative Bilateral    POSTURE: No Significant postural limitations   PALPATION: Left sided L4 UPA TTP    LUMBAR ROM:    AROM eval  Flexion 100%  Extension 100%*  Right lateral flexion 100%  Left lateral flexion 100%  Right rotation 100%  Left rotation 100%   (Blank rows = not tested) *=Painful    LOWER EXTREMITY ROM:      Active  Right eval Left eval  Hip flexion      Hip extension      Hip abduction      Hip adduction      Hip internal rotation      Hip external rotation      Knee flexion      Knee extension      Ankle dorsiflexion      Ankle plantarflexion      Ankle inversion      Ankle eversion       (Blank rows = not tested)   LOWER EXTREMITY MMT:     MMT Right eval Left eval  Hip flexion 4+ 4+  Hip extension 4- 4-  Hip abduction 4 4  Hip adduction 4- 4-  Hip internal rotation      Hip external rotation      Knee flexion 4 4  Knee extension 4- 4-  Ankle dorsiflexion 5 5  Ankle plantarflexion      Ankle inversion      Ankle eversion       (Blank rows = not tested)   LUMBAR SPECIAL TESTS:  Straight leg raise test: Negative, Slump test: Negative, FABER test: Positive, Thomas test: Positive, and FADIR test: Positive Left    FUNCTIONAL TESTS:  Squat- Able to perform mini-squat without difficulty    GAIT: Distance walked: 50 ft  Assistive device utilized: None Level of assistance:  Complete Independence Comments:  No gait deficits noted    TODAY'S TREATMENT:                                                                                                                              DATE:   05/30/22: Nu-Step at seat 9 with resistance 3- 5 min Mini-Squat to 27 inch mat height 1 x 10  Mini-Squat to 27 inch mat height with #8 lb DB 2 x 10 Supine Bent knee leg lift 3 x 30 sec  Supine Straight Leg 3 x 10  Standing Hip Abduction with 1 UE support 3 x 10    05/26/22: Nu-Step at seat 10 with resistance 2- 5 min OMEGA Leg Press # 95 3 x 10  Standing Hip Abduction with BUE support 3 x 10  Mini-Squats 3 x 10  -min VC to space out feet and to tap buttocks on elevated table  Prone Quad Stretch 3 x 30 sec   05/24/22: Matrix Recumbent Bicycle 2 min  -Pt reports increased bilateral knee pain  Nu-Step with seat and arms at 10 with resistance at 2 for 5 min  Seated Sciatic Tensioner on LLE 1 x 10  Abdominal Strength 4+ Good 4+ Good + Pelvic tilt & lower an extended leg 04 Good Pelvic tilt & lower an extended leg 15 Abdominal Strength 4-= Pelvic tilt lower and extend legs to 30 degrees  -Pt able to perform without raising low back  Pelvic Tilt 1 x 10 with 3 sec hold  -min VC to perform breathing holds during tightening of abs  Side Lying Clam Shell 1 x 10  Side Lying Clam Shell 1 x 10 with red band  Side Lying Clam Shell 1 x 10 with green band  Side Lying Clam Shell 2 x 10 with blue band     PATIENT EDUCATION:  Education details: form and technique for appropriate exercise and explanation about underlying lumbar spine pathology  Person educated: Patient Education method: Consulting civil engineer, Demonstration, Verbal cues, and Handouts Education comprehension: verbalized understanding, returned demonstration, and verbal cues required   HOME EXERCISE PROGRAM: Access Code: [email or web address] URL: [email or web address] Date: 05/30/2022 Prepared by: Bradly Chris  Exercises -  Seated Hip External Rotation Stretch  - 1 x daily - 3 reps - 60 sec  hold - Prone Quadriceps Stretch with Strap  - 1 x daily - 3 reps - 30-60 sec hold - Supine Lower Trunk Rotation  - 1 x daily - 3 sets - 10 reps - Modified Thomas Stretch  - 1 x daily - 3 reps - 30-60 sec  hold - Seated Sciatic Tensioner  - 1 x daily - 1 sets - 10 reps - Standing Hip Abduction with Counter Support  - 3 x weekly - 3 sets - 10 reps - Mini Squat  - 3 x weekly - 3 sets - 10 reps - Supine Double Bent Leg Lift  - 3-4 x weekly - 3 reps - 30 sec  hold - Supine Active Straight Leg Raise  - 3 x weekly - 3 sets - 10 reps   ASSESSMENT:   CLINICAL IMPRESSION: Pt shows improved LE strength with ability to perform exercises with increased resistance and decreased UE support. She did not have an increase in her low back pain while performing exercises. She will continue benefit from skilled PT to improve hip strength and flexibility and to reduce low back pain and radicular symptoms to ambulate longer distances and stand for longer periods of time to care for her nieces and nephew and perform ADLs.    OBJECTIVE IMPAIRMENTS: decreased strength, impaired flexibility, impaired sensation, obesity, and pain.    ACTIVITY LIMITATIONS: carrying, lifting, bending, sitting, standing, sleeping, and locomotion level   PARTICIPATION LIMITATIONS: shopping, community activity, occupation, and child care   PERSONAL FACTORS: Fitness and Time since onset of injury/illness/exacerbation are also affecting patient's functional outcome.    REHAB POTENTIAL: Good   CLINICAL DECISION MAKING: Stable/uncomplicated   EVALUATION COMPLEXITY: Low     GOALS: Goals reviewed with patient? No   SHORT TERM GOALS: Target date: 06/01/2022   Pt will be independent with HEP in order to improve strength and balance in order to decrease fall risk and improve function at home and work. Baseline: Performing exercise program independently  Goal status:  Ongoing        LONG TERM GOALS: Target date: 07/27/2022   Patient will have improved function and activity level as evidenced by an increase in FOTO score by 10 points or more.  Baseline: 46/100 Goal status: Ongoing    2.  Patient will improve hip strength by 1/3 MMT (ie 4- to 4) to offload lumbar spinal structures and to increase lumbar stability for improved lumbar pain and symptom relief.  Baseline: Hip Ext R/L 4-/4-, Hip Abd R/L 4/4, Hip Add R/L 4-/4-  Goal status: Ongoing    3.  Patient will be able to maintain a neutral spine while performing >=10 mini-squats as evidence of good abdominal strength for increased spinal stability.  Baseline: Able to perform 10 mini-squats with a neutral spine  Goal status: Achieved   4. Patient will be able to perform a normal pelvic tilt and lower legs to 45, 30, 15 and 0 degrees without lumbar extension compensation as evidenced of improved abdominal strength.  Baseline: 4-; Able to lower to 30 and 0 degrees with good pelvic tilt  Goal status: Not Met        PLAN:   PT FREQUENCY: 1-2x/week   PT DURATION: 10 weeks   PLANNED INTERVENTIONS: Therapeutic exercises, Neuromuscular re-education, Gait training, Patient/Family education, Self Care, Joint mobilization, Joint manipulation, Aquatic Therapy, Dry Needling, Electrical stimulation, Spinal manipulation, Spinal mobilization, Cryotherapy, Moist heat, Ultrasound, Manual therapy, and Re-evaluation.   PLAN FOR NEXT SESSION: Dead Bugs. Lateral step ups. Try Bird Dogs  Bradly Chris PT, DPT  05/30/2022, 9:42 AM

## 2022-06-02 ENCOUNTER — Ambulatory Visit: Payer: 59 | Admitting: Physical Therapy

## 2022-06-06 ENCOUNTER — Ambulatory Visit: Payer: 59 | Admitting: Physical Therapy

## 2022-06-06 ENCOUNTER — Encounter: Payer: Self-pay | Admitting: Physical Therapy

## 2022-06-06 DIAGNOSIS — G8929 Other chronic pain: Secondary | ICD-10-CM

## 2022-06-06 DIAGNOSIS — M5459 Other low back pain: Secondary | ICD-10-CM

## 2022-06-06 NOTE — Therapy (Signed)
OUTPATIENT PHYSICAL THERAPY TREATMENT NOTE   Patient Name: Tracey Cunningham MRN: [id number] DOB:Apr 06, 1980, 42 y.o., female Today's Date: 06/06/2022  PCP: Dr. Loura Pardon  REFERRING PROVIDER: Dr. Eliezer Lofts   END OF SESSION:   PT End of Session - 06/06/22 0854     Visit Number 5    Number of Visits 20    Date for PT Re-Evaluation 07/27/22    Authorization Type UHC 2024    Authorization Time Period 23 VISITS PER YEAR    Authorization - Visit Number 5    Authorization - Number of Visits 23    Progress Note Due on Visit 10    PT Start Time 0850    PT Stop Time 0930    PT Time Calculation (min) 40 min    Activity Tolerance Patient tolerated treatment well    Behavior During Therapy [facility] for tasks assessed/performed              Past Medical History:  Diagnosis Date   Anemia    iron deficiency    Cerumen impaction    Family history of breast cancer    5/21 cancer genetic testing letter sent   Hypertension    borderline   Irregular periods    OBGYN- west side    Obesity    weight loss with diet and exercise    Past Surgical History:  Procedure Laterality Date   APPENDECTOMY  10/2005   CHOLECYSTECTOMY     foot fracture     right foot    Patient Active Problem List   Diagnosis Date Noted   Acute left-sided low back pain with left-sided sciatica 02/18/2022   Liver lesion 09/28/2020   Pulmonary nodule 08/23/2020   Lower abdominal pain 05/08/2020   Left groin pain 04/10/2020   Elevated liver transaminase level 01/01/2019   Lump on finger, right 10/10/2018   Family history of breast cancer in mother 02/02/2016   GERD (gastroesophageal reflux disease) 11/05/2014   Routine gynecological examination 01/31/2012   Routine general medical examination at a health care facility 01/31/2012   Cerumen impaction 05/14/2009   IRRITABLE BOWEL SYNDROME 04/23/2008   Morbid obesity ([place]) 04/19/2007   ANEMIA-IRON DEFICIENCY 08/24/2006   Essential hypertension 08/24/2006    MENORRHALGIA 08/24/2006    REFERRING DIAG: Left sided low back pain   THERAPY DIAG:  Other low back pain  Chronic left-sided low back pain with sciatica, sciatica laterality unspecified  Rationale for Evaluation and Treatment Rehabilitation  PERTINENT HISTORY: Pt reports starting experiencing left sided low back pain about 3 years ago with an insidious onset. She reports pain that spreads from left side of back down to left hip and left groin and to medial side of ankle. She did physical therapy several years ago which did help relieve her symptoms. Patient has trouble standing for long periods of time and walking for long periods of time. She wants to be able to take her niece and nephew to aquarium, but cannot tolerate how much walking she has to do.   PRECAUTIONS: None   SUBJECTIVE:  SUBJECTIVE STATEMENT:  Pt states that she has been having increased pain ever since last session. She thinks it had something to do with the exercises she did last session.     PAIN:  Are you having pain? No   OBJECTIVE: (objective measures completed at initial evaluation unless otherwise dated)  VITALS: BP146/89 HR 103 SpO2 98   DIAGNOSTIC FINDINGS:  CLINICAL DATA:  left sided low back pain with sciatica   EXAM: LUMBAR SPINE - COMPLETE 4+ VIEW   COMPARISON:  CT 05/26/2020   FINDINGS: There are 5 non-rib-bearing lumbar vertebrae. No evidence of lumbar spine fracture. There is mild-to-moderate degenerative disc disease, most prominent at L3-L4, L4-L5, and L5-S1. There is mild lower lumbar predominant facet arthropathy. Alignment is normal.   IMPRESSION: Mild to moderate multilevel degenerative disc disease, most prominent at L3-L4, L4-L5, and L5-S1.   Mild lower lumbar predominant facet arthropathy.      Electronically Signed   By: Maurine Simmering M.D.   On: 03/20/2022 10:26     PATIENT SURVEYS:  FOTO 46/100 with target of 63    SCREENING FOR RED FLAGS: Bowel or bladder incontinence: No Spinal tumors: No Cauda equina syndrome: No Compression fracture: No Abdominal aneurysm: No   COGNITION: Overall cognitive status: Within functional limits for tasks assessed                          SENSATION: Light touch: Impaired    MUSCLE LENGTH: Hamstrings: Right 90 deg; Left 90 deg Thomas test: Positive bilateral  Ely's Test: Positive Bilateral  Ober's Test: Negative Bilateral    POSTURE: No Significant postural limitations   PALPATION: Left sided L4 UPA TTP    LUMBAR ROM:    AROM eval  Flexion 100%  Extension 100%*  Right lateral flexion 100%  Left lateral flexion 100%  Right rotation 100%  Left rotation 100%   (Blank rows = not tested) *=Painful    LOWER EXTREMITY ROM:      Active  Right eval Left eval  Hip flexion      Hip extension      Hip abduction      Hip adduction      Hip internal rotation      Hip external rotation      Knee flexion      Knee extension      Ankle dorsiflexion      Ankle plantarflexion      Ankle inversion      Ankle eversion       (Blank rows = not tested)   LOWER EXTREMITY MMT:     MMT Right eval Left eval  Hip flexion 4+ 4+  Hip extension 4- 4-  Hip abduction 4 4  Hip adduction 4- 4-  Hip internal rotation      Hip external rotation      Knee flexion 4 4  Knee extension 4- 4-  Ankle dorsiflexion 5 5  Ankle plantarflexion      Ankle inversion      Ankle eversion       (Blank rows = not tested)   LUMBAR SPECIAL TESTS:  Straight leg raise test: Negative, Slump test: Negative, FABER test: Positive, Thomas test: Positive, and FADIR test: Positive Left    FUNCTIONAL TESTS:  Squat- Able to perform mini-squat without difficulty    GAIT: Distance walked: 50 ft  Assistive device utilized: None Level of assistance:  Complete Independence Comments: No gait deficits  noted    TODAY'S TREATMENT:                                                                                                                              DATE:   06/06/22: Nu-Step at seat 9 with resistance 2- 5 min Abdominal Reaches from [facility] lying Position 3 x 10  Lateral Step down on 4 inch step with 1 UE support 3 x 10 OMEGA Leg Press #85 3 x 10  Mini-Squat with #8 lbs with elbow extended to 22 inch mat height 3 x 10    05/30/22: Nu-Step at seat 9 with resistance 3- 5 min Mini-Squat to 27 inch mat height 1 x 10  Mini-Squat to 27 inch mat height with #8 lb DB 2 x 10 Supine Bent knee leg lift 3 x 30 sec  Supine Straight Leg 3 x 10  Standing Hip Abduction with 1 UE support 3 x 10    05/26/22: Nu-Step at seat 10 with resistance 2- 5 min OMEGA Leg Press # 95 3 x 10  Standing Hip Abduction with BUE support 3 x 10  Mini-Squats 3 x 10  -min VC to space out feet and to tap buttocks on elevated table  Prone Quad Stretch 3 x 30 sec   05/24/22: Matrix Recumbent Bicycle 2 min  -Pt reports increased bilateral knee pain  Nu-Step with seat and arms at 10 with resistance at 2 for 5 min  Seated Sciatic Tensioner on LLE 1 x 10  Abdominal Strength 4+ Good 4+ Good + Pelvic tilt & lower an extended leg 04 Good Pelvic tilt & lower an extended leg 15 Abdominal Strength 4-= Pelvic tilt lower and extend legs to 30 degrees  -Pt able to perform without raising low back  Pelvic Tilt 1 x 10 with 3 sec hold  -min VC to perform breathing holds during tightening of abs  Side Lying Clam Shell 1 x 10  Side Lying Clam Shell 1 x 10 with red band  Side Lying Clam Shell 1 x 10 with green band  Side Lying Clam Shell 2 x 10 with blue band     PATIENT EDUCATION:  Education details: form and technique for appropriate exercise and explanation about underlying lumbar spine pathology  Person educated: Patient Education method: Consulting civil engineer, Demonstration, Verbal  cues, and Handouts Education comprehension: verbalized understanding, returned demonstration, and verbal cues required   HOME EXERCISE PROGRAM: Access Code: [email or web address] URL: [email or web address] Date: 06/06/2022 Prepared by: Bradly Chris  Exercises - Seated Hip External Rotation Stretch  - 1 x daily - 3 reps - 60 sec  hold - Prone Quadriceps Stretch with Strap  - 1 x daily - 3 reps - 30-60 sec hold - Supine Lower Trunk Rotation  - 1 x daily - 3 sets - 10 reps - Modified Thomas Stretch  - 1 x daily - 3 reps - 30-60 sec  hold - Seated Sciatic Tensioner  - 1 x  daily - 1 sets - 10 reps - Standing Hip Abduction with Counter Support  - 3 x weekly - 3 sets - 10 reps - Mini Squat  - 3 x weekly - 3 sets - 10 reps - Supine Active Straight Leg Raise  - 3 x weekly - 3 sets - 10 reps - Mini Squat with Chair  - 3 x weekly - 3 sets - 10 reps   ASSESSMENT:   CLINICAL IMPRESSION: Pt shows no provocation of low back pain or radicular symptoms. Modified home exercise plan to exclude leg lift to avoid increased pressure on low back. Increased resistance for mini squat with emphasis on abdominal strength. She will continue to benefit from skilled PT to improve hip strength and flexibility and to reduce low back pain and radicular symptoms to ambulate longer distances and stand for longer periods of time to care for her nieces and nephew and perform ADLs.    OBJECTIVE IMPAIRMENTS: decreased strength, impaired flexibility, impaired sensation, obesity, and pain.    ACTIVITY LIMITATIONS: carrying, lifting, bending, sitting, standing, sleeping, and locomotion level   PARTICIPATION LIMITATIONS: shopping, community activity, occupation, and child care   PERSONAL FACTORS: Fitness and Time since onset of injury/illness/exacerbation are also affecting patient's functional outcome.    REHAB POTENTIAL: Good   CLINICAL DECISION MAKING: Stable/uncomplicated   EVALUATION COMPLEXITY: Low      GOALS: Goals reviewed with patient? No   SHORT TERM GOALS: Target date: 06/01/2022   Pt will be independent with HEP in order to improve strength and balance in order to decrease fall risk and improve function at home and work. Baseline: Performing exercise program independently  Goal status: Ongoing        LONG TERM GOALS: Target date: 07/27/2022   Patient will have improved function and activity level as evidenced by an increase in FOTO score by 10 points or more.  Baseline: 46/100 Goal status: Ongoing    2.  Patient will improve hip strength by 1/3 MMT (ie 4- to 4) to offload lumbar spinal structures and to increase lumbar stability for improved lumbar pain and symptom relief.  Baseline: Hip Ext R/L 4-/4-, Hip Abd R/L 4/4, Hip Add R/L 4-/4-  Goal status: Ongoing    3.  Patient will be able to maintain a neutral spine while performing >=10 mini-squats as evidence of good abdominal strength for increased spinal stability.  Baseline: Able to perform 10 mini-squats with a neutral spine  Goal status: Achieved   4. Patient will be able to perform a normal pelvic tilt and lower legs to 45, 30, 15 and 0 degrees without lumbar extension compensation as evidenced of improved abdominal strength.  Baseline: 4-; Able to lower to 30 and 0 degrees with good pelvic tilt  Goal status: Not Met        PLAN:   PT FREQUENCY: 1-2x/week   PT DURATION: 10 weeks   PLANNED INTERVENTIONS: Therapeutic exercises, Neuromuscular re-education, Gait training, Patient/Family education, Self Care, Joint mobilization, Joint manipulation, Aquatic Therapy, Dry Needling, Electrical stimulation, Spinal manipulation, Spinal mobilization, Cryotherapy, Moist heat, Ultrasound, Manual therapy, and Re-evaluation.   PLAN FOR NEXT SESSION:  Bird Dogs and Standing Hip Abduction with increased resistance   Bradly Chris PT, DPT  06/06/2022, 8:55 AM

## 2022-06-09 ENCOUNTER — Ambulatory Visit: Payer: 59 | Admitting: Physical Therapy

## 2022-06-09 DIAGNOSIS — M5459 Other low back pain: Secondary | ICD-10-CM | POA: Diagnosis not present

## 2022-06-09 DIAGNOSIS — G8929 Other chronic pain: Secondary | ICD-10-CM

## 2022-06-09 NOTE — Therapy (Signed)
OUTPATIENT PHYSICAL THERAPY TREATMENT NOTE   Patient Name: Tracey Cunningham MRN: [id number] DOB:[birth year], 42 y.o., female Today's Date: 06/09/2022  PCP: Dr. Loura Pardon  REFERRING PROVIDER: Dr. Eliezer Lofts   END OF SESSION:   PT End of Session - 06/09/22 0945     Visit Number 6    Number of Visits 20    Date for PT Re-Evaluation 07/27/22    Authorization Type UHC 2024    Authorization Time Period 23 VISITS PER YEAR    Authorization - Visit Number 6    Authorization - Number of Visits 23    Progress Note Due on Visit 10    PT Start Time 0935    PT Stop Time 1015    PT Time Calculation (min) 40 min    Activity Tolerance Patient tolerated treatment well    Behavior During Therapy [facility] for tasks assessed/performed              Past Medical History:  Diagnosis Date   Anemia    iron deficiency    Cerumen impaction    Family history of breast cancer    5/21 cancer genetic testing letter sent   Hypertension    borderline   Irregular periods    OBGYN- [place] side    Obesity    weight loss with diet and exercise    Past Surgical History:  Procedure Laterality Date   APPENDECTOMY  10/2005   CHOLECYSTECTOMY     foot fracture     right foot    Patient Active Problem List   Diagnosis Date Noted   Acute left-sided low back pain with left-sided sciatica 02/18/2022   Liver lesion 09/28/2020   Pulmonary nodule 08/23/2020   Lower abdominal pain 05/08/2020   Left groin pain 04/10/2020   Elevated liver transaminase level 01/01/2019   Lump on finger, right 10/10/2018   Family history of breast cancer in mother 02/02/2016   GERD (gastroesophageal reflux disease) 11/05/2014   Routine gynecological examination 01/31/2012   Routine general medical examination at a health care facility 01/31/2012   Cerumen impaction 05/14/2009   IRRITABLE BOWEL SYNDROME 04/23/2008   Morbid obesity ([place]) 04/19/2007   ANEMIA-IRON DEFICIENCY 08/24/2006   Essential hypertension 08/24/2006    MENORRHALGIA 08/24/2006    REFERRING DIAG: Left sided low back pain   THERAPY DIAG:  Other low back pain  Chronic left-sided low back pain with sciatica, sciatica laterality unspecified  Rationale for Evaluation and Treatment Rehabilitation  PERTINENT HISTORY: Pt reports starting experiencing left sided low back pain about 3 years ago with an insidious onset. She reports pain that spreads from left side of back down to left hip and left groin and to medial side of ankle. She did physical therapy several years ago which did help relieve her symptoms. Patient has trouble standing for long periods of time and walking for long periods of time. She wants to be able to take her niece and nephew to aquarium, but cannot tolerate how much walking she has to do.   PRECAUTIONS: None   SUBJECTIVE:  SUBJECTIVE STATEMENT:  Pt reports feeling no flare up in her low back pain since last session.  She continues to perform home exercises without increased pain as well.    PAIN:  Are you having pain? No   OBJECTIVE: (objective measures completed at initial evaluation unless otherwise dated)  VITALS: BP146/89 HR 103 SpO2 98   DIAGNOSTIC FINDINGS:  CLINICAL DATA:  left sided low back pain with sciatica   EXAM: LUMBAR SPINE - COMPLETE 4+ VIEW   COMPARISON:  CT 05/26/2020   FINDINGS: There are 5 non-rib-bearing lumbar vertebrae. No evidence of lumbar spine fracture. There is mild-to-moderate degenerative disc disease, most prominent at L3-L4, L4-L5, and L5-S1. There is mild lower lumbar predominant facet arthropathy. Alignment is normal.   IMPRESSION: Mild to moderate multilevel degenerative disc disease, most prominent at L3-L4, L4-L5, and L5-S1.   Mild lower lumbar predominant facet arthropathy.      Electronically Signed   By: Maurine Simmering M.D.   On: 03/20/2022 10:26     PATIENT SURVEYS:  FOTO 46/100 with target of 63    SCREENING FOR RED FLAGS: Bowel or bladder incontinence: No Spinal tumors: No Cauda equina syndrome: No Compression fracture: No Abdominal aneurysm: No   COGNITION: Overall cognitive status: Within functional limits for tasks assessed                          SENSATION: Light touch: Impaired    MUSCLE LENGTH: Hamstrings: Right 90 deg; Left 90 deg Thomas test: Positive bilateral  Ely's Test: Positive Bilateral  Ober's Test: Negative Bilateral    POSTURE: No Significant postural limitations   PALPATION: Left sided L4 UPA TTP    LUMBAR ROM:    AROM eval  Flexion 100%  Extension 100%*  Right lateral flexion 100%  Left lateral flexion 100%  Right rotation 100%  Left rotation 100%   (Blank rows = not tested) *=Painful    LOWER EXTREMITY ROM:      Active  Right eval Left eval  Hip flexion      Hip extension      Hip abduction      Hip adduction      Hip internal rotation      Hip external rotation      Knee flexion      Knee extension      Ankle dorsiflexion      Ankle plantarflexion      Ankle inversion      Ankle eversion       (Blank rows = not tested)   LOWER EXTREMITY MMT:     MMT Right eval Left eval  Hip flexion 4+ 4+  Hip extension 4- 4-  Hip abduction 4 4  Hip adduction 4- 4-  Hip internal rotation      Hip external rotation      Knee flexion 4 4  Knee extension 4- 4-  Ankle dorsiflexion 5 5  Ankle plantarflexion      Ankle inversion      Ankle eversion       (Blank rows = not tested)   LUMBAR SPECIAL TESTS:  Straight leg raise test: Negative, Slump test: Negative, FABER test: Positive, Thomas test: Positive, and FADIR test: Positive Left    FUNCTIONAL TESTS:  Squat- Able to perform mini-squat without difficulty    GAIT: Distance walked: 50 ft  Assistive device utilized: None Level of assistance:  Complete Independence Comments: No gait deficits noted  TODAY'S TREATMENT:                                                                                                                              DATE:   06/09/22: Nu-Step at seat 9 with resistance 2- 5 min Abdominal Reaches from [facility] lying Position with jug of water (8 lbs) 3 x 10  Standing Hip Abduction with 1 UE support  1 x 10  Side Lying Hip Abduction 3 x 10  Straight Leg Raise with Red Band 3 x 10   06/06/22: Nu-Step at seat 9 with resistance 2- 5 min Abdominal Reaches from [facility] lying Position 3 x 10  Lateral Step down on 4 inch step with 1 UE support 3 x 10 OMEGA Leg Press #85 3 x 10  Mini-Squat with #8 lbs with elbow extended to 22 inch mat height 3 x 10    05/30/22: Nu-Step at seat 9 with resistance 3- 5 min Mini-Squat to 27 inch mat height 1 x 10  Mini-Squat to 27 inch mat height with #8 lb DB 2 x 10 Supine Bent knee leg lift 3 x 30 sec  Supine Straight Leg 3 x 10  Standing Hip Abduction with 1 UE support 3 x 10    05/26/22: Nu-Step at seat 10 with resistance 2- 5 min OMEGA Leg Press # 95 3 x 10  Standing Hip Abduction with BUE support 3 x 10  Mini-Squats 3 x 10  -min VC to space out feet and to tap buttocks on elevated table  Prone Quad Stretch 3 x 30 sec   05/24/22: Matrix Recumbent Bicycle 2 min  -Pt reports increased bilateral knee pain  Nu-Step with seat and arms at 10 with resistance at 2 for 5 min  Seated Sciatic Tensioner on LLE 1 x 10  Abdominal Strength 4+ Good 4+ Good + Pelvic tilt & lower an extended leg 04 Good Pelvic tilt & lower an extended leg 15 Abdominal Strength 4-= Pelvic tilt lower and extend legs to 30 degrees  -Pt able to perform without raising low back  Pelvic Tilt 1 x 10 with 3 sec hold  -min VC to perform breathing holds during tightening of abs  Side Lying Clam Shell 1 x 10  Side Lying Clam Shell 1 x 10 with red band  Side Lying Clam Shell 1 x 10 with green band  Side Lying  Clam Shell 2 x 10 with blue band     PATIENT EDUCATION:  Education details: form and technique for appropriate exercise and explanation about underlying lumbar spine pathology  Person educated: Patient Education method: Consulting civil engineer, Demonstration, Verbal cues, and Handouts Education comprehension: verbalized understanding, returned demonstration, and verbal cues required   HOME EXERCISE PROGRAM: Access Code: [email or web address] URL: [email or web address] Date: 06/09/2022 Prepared by: Bradly Chris  Exercises - Seated Hip External Rotation Stretch  - 1 x daily - 3 reps - 60 sec  hold - Prone Quadriceps Stretch with Strap  -  1 x daily - 3 reps - 30-60 sec hold - Supine Lower Trunk Rotation  - 1 x daily - 3 sets - 10 reps - Modified Thomas Stretch  - 1 x daily - 3 reps - 30-60 sec  hold - Seated Sciatic Tensioner  - 1 x daily - 1 sets - 10 reps - Mini Squat  - 3 x weekly - 3 sets - 10 reps - Mini Squat with Chair  - 3 x weekly - 3 sets - 10 reps - Sidelying Hip Abduction  - 3 x weekly - 3 sets - 10 reps - Active Straight Leg Raise Advanced  - 3 x weekly - 3 sets - 10 reps   ASSESSMENT:   CLINICAL IMPRESSION: Pt continues to show tolerance to activity with no increase in symptoms despite an increase in resistance. She demonstrates improved LE strength with ability to perform LE exercises with increased resistance. She will continue to benefit from skilled PT to improve hip strength and flexibility and to reduce low back pain and radicular symptoms to ambulate longer distances and stand for longer periods of time to care for her nieces and nephew and perform ADLs.   OBJECTIVE IMPAIRMENTS: decreased strength, impaired flexibility, impaired sensation, obesity, and pain.    ACTIVITY LIMITATIONS: carrying, lifting, bending, sitting, standing, sleeping, and locomotion level   PARTICIPATION LIMITATIONS: shopping, community activity, occupation, and child care   PERSONAL FACTORS: Fitness  and Time since onset of injury/illness/exacerbation are also affecting patient's functional outcome.    REHAB POTENTIAL: Good   CLINICAL DECISION MAKING: Stable/uncomplicated   EVALUATION COMPLEXITY: Low     GOALS: Goals reviewed with patient? No   SHORT TERM GOALS: Target date: 06/01/2022   Pt will be independent with HEP in order to improve strength and balance in order to decrease fall risk and improve function at home and work. Baseline: Performing exercise program independently  Goal status: Ongoing        LONG TERM GOALS: Target date: 07/27/2022   Patient will have improved function and activity level as evidenced by an increase in FOTO score by 10 points or more.  Baseline: 46/100 Goal status: Ongoing    2.  Patient will improve hip strength by 1/3 MMT (ie 4- to 4) to offload lumbar spinal structures and to increase lumbar stability for improved lumbar pain and symptom relief.  Baseline: Hip Ext R/L 4-/4-, Hip Abd R/L 4/4, Hip Add R/L 4-/4-  Goal status: Ongoing    3.  Patient will be able to maintain a neutral spine while performing >=10 mini-squats as evidence of good abdominal strength for increased spinal stability.  Baseline: Able to perform 10 mini-squats with a neutral spine  Goal status: Achieved   4. Patient will be able to perform a normal pelvic tilt and lower legs to 45, 30, 15 and 0 degrees without lumbar extension compensation as evidenced of improved abdominal strength.  Baseline: 4-; Able to lower to 30 and 0 degrees with good pelvic tilt  Goal status: Not Met        PLAN:   PT FREQUENCY: 1-2x/week   PT DURATION: 10 weeks   PLANNED INTERVENTIONS: Therapeutic exercises, Neuromuscular re-education, Gait training, Patient/Family education, Self Care, Joint mobilization, Joint manipulation, Aquatic Therapy, Dry Needling, Electrical stimulation, Spinal manipulation, Spinal mobilization, Cryotherapy, Moist heat, Ultrasound, Manual therapy, and  Re-evaluation.   PLAN FOR NEXT SESSION:  Progress LE exercises: Bird Dogs and progress hip abduction    Bradly Chris PT, DPT  06/09/2022, 9:46 AM

## 2022-06-14 ENCOUNTER — Encounter: Payer: Self-pay | Admitting: Physical Therapy

## 2022-06-14 ENCOUNTER — Ambulatory Visit: Payer: 59 | Attending: Family Medicine | Admitting: Physical Therapy

## 2022-06-14 DIAGNOSIS — G8929 Other chronic pain: Secondary | ICD-10-CM | POA: Insufficient documentation

## 2022-06-14 DIAGNOSIS — M5459 Other low back pain: Secondary | ICD-10-CM | POA: Diagnosis present

## 2022-06-14 DIAGNOSIS — M5440 Lumbago with sciatica, unspecified side: Secondary | ICD-10-CM | POA: Insufficient documentation

## 2022-06-14 NOTE — Therapy (Signed)
OUTPATIENT PHYSICAL THERAPY TREATMENT NOTE   Patient Name: Tracey Cunningham MRN: [id number] DOB:[birth year], 42 y.o., female Today's Date: 06/14/2022  PCP: Dr. Loura Pardon  REFERRING PROVIDER: Dr. Eliezer Lofts   END OF SESSION:   PT End of Session - 06/14/22 0932     Visit Number 7    Number of Visits 20    Date for PT Re-Evaluation 07/27/22    Authorization Type UHC 2024    Authorization Time Period 23 VISITS PER YEAR    Authorization - Visit Number 7    Authorization - Number of Visits 23    Progress Note Due on Visit 10    PT Start Time 0930    PT Stop Time 1015    PT Time Calculation (min) 45 min    Activity Tolerance Patient tolerated treatment well    Behavior During Therapy [facility] for tasks assessed/performed              Past Medical History:  Diagnosis Date   Anemia    iron deficiency    Cerumen impaction    Family history of breast cancer    5/21 cancer genetic testing letter sent   Hypertension    borderline   Irregular periods    OBGYN- west side    Obesity    weight loss with diet and exercise    Past Surgical History:  Procedure Laterality Date   APPENDECTOMY  10/2005   CHOLECYSTECTOMY     foot fracture     right foot    Patient Active Problem List   Diagnosis Date Noted   Acute left-sided low back pain with left-sided sciatica 02/18/2022   Liver lesion 09/28/2020   Pulmonary nodule 08/23/2020   Lower abdominal pain 05/08/2020   Left groin pain 04/10/2020   Elevated liver transaminase level 01/01/2019   Lump on finger, right 10/10/2018   Family history of breast cancer in mother 02/02/2016   GERD (gastroesophageal reflux disease) 11/05/2014   Routine gynecological examination 01/31/2012   Routine general medical examination at a health care facility 01/31/2012   Cerumen impaction 05/14/2009   IRRITABLE BOWEL SYNDROME 04/23/2008   Morbid obesity ([place]) 04/19/2007   ANEMIA-IRON DEFICIENCY 08/24/2006   Essential hypertension 08/24/2006    MENORRHALGIA 08/24/2006    REFERRING DIAG: Left sided low back pain   THERAPY DIAG:  Other low back pain  Chronic left-sided low back pain with sciatica, sciatica laterality unspecified  Rationale for Evaluation and Treatment Rehabilitation  PERTINENT HISTORY: Pt reports starting experiencing left sided low back pain about 3 years ago with an insidious onset. She reports pain that spreads from left side of back down to left hip and left groin and to medial side of ankle. She did physical therapy several years ago which did help relieve her symptoms. Patient has trouble standing for long periods of time and walking for long periods of time. She wants to be able to take her niece and nephew to aquarium, but cannot tolerate how much walking she has to do.   PRECAUTIONS: None   SUBJECTIVE:  SUBJECTIVE STATEMENT:  Pt minor pain flares since last session. She started experiencing swelling in her left foot.   PAIN:  Are you having pain? Yes: NPRS scale: 2-3/10 Pain location: Left side low back pain that radiates down left groin  Pain description: Achy  Aggravating factors: Sitting for long periods of time  Relieving factors: Heat    OBJECTIVE: (objective measures completed at initial evaluation unless otherwise dated)  VITALS: BP146/89 HR 103 SpO2 98   DIAGNOSTIC FINDINGS:  CLINICAL DATA:  left sided low back pain with sciatica   EXAM: LUMBAR SPINE - COMPLETE 4+ VIEW   COMPARISON:  CT 05/26/2020   FINDINGS: There are 5 non-rib-bearing lumbar vertebrae. No evidence of lumbar spine fracture. There is mild-to-moderate degenerative disc disease, most prominent at L3-L4, L4-L5, and L5-S1. There is mild lower lumbar predominant facet arthropathy. Alignment is normal.   IMPRESSION: Mild to moderate  multilevel degenerative disc disease, most prominent at L3-L4, L4-L5, and L5-S1.   Mild lower lumbar predominant facet arthropathy.     Electronically Signed   By: Maurine Simmering M.D.   On: 03/20/2022 10:26     PATIENT SURVEYS:  FOTO 46/100 with target of 63    SCREENING FOR RED FLAGS: Bowel or bladder incontinence: No Spinal tumors: No Cauda equina syndrome: No Compression fracture: No Abdominal aneurysm: No   COGNITION: Overall cognitive status: Within functional limits for tasks assessed                          SENSATION: Light touch: Impaired    MUSCLE LENGTH: Hamstrings: Right 90 deg; Left 90 deg Thomas test: Positive bilateral  Ely's Test: Positive Bilateral  Ober's Test: Negative Bilateral    POSTURE: No Significant postural limitations   PALPATION: Left sided L4 UPA TTP    LUMBAR ROM:    AROM eval  Flexion 100%  Extension 100%*  Right lateral flexion 100%  Left lateral flexion 100%  Right rotation 100%  Left rotation 100%   (Blank rows = not tested) *=Painful    LOWER EXTREMITY ROM:      Active  Right eval Left eval  Hip flexion      Hip extension      Hip abduction      Hip adduction      Hip internal rotation      Hip external rotation      Knee flexion      Knee extension      Ankle dorsiflexion      Ankle plantarflexion      Ankle inversion      Ankle eversion       (Blank rows = not tested)   LOWER EXTREMITY MMT:     MMT Right eval Left eval  Hip flexion 4+ 4+  Hip extension 4- 4-  Hip abduction 4 4  Hip adduction 4- 4-  Hip internal rotation      Hip external rotation      Knee flexion 4 4  Knee extension 4- 4-  Ankle dorsiflexion 5 5  Ankle plantarflexion      Ankle inversion      Ankle eversion       (Blank rows = not tested)   LUMBAR SPECIAL TESTS:  Straight leg raise test: Negative, Slump test: Negative, FABER test: Positive, Thomas test: Positive, and FADIR test: Positive Left    FUNCTIONAL TESTS:  Squat-  Able to perform mini-squat without difficulty  GAIT: Distance walked: 50 ft  Assistive device utilized: None Level of assistance: Complete Independence Comments: No gait deficits noted    TODAY'S TREATMENT:                                                                                                                              DATE:   06/14/22: Nu-Step at seat 9 with resistance 3- 5 min OMEGA Leg Press #95 with seat at 4 3 x 10  OMEGA cable Palloff Press #10 3 x 10  Standing Bird Dogs 3 x 10  -min VC for increased hip flexion  Standing Lat Dorsi Stretch 2 x 30 sec  Dead Lift with #10 KB with tap on 1 ft platform 1 x 10  -Pt reports increased pain in left side of low back  Hip Hinges to 45 degrees 2 x 10  -min VC to generate movement at hips  -Pt reports increased left sided low back pain symptoms   06/09/22: Nu-Step at seat 9 with resistance 2- 5 min Abdominal Reaches from [facility] lying Position with jug of water (8 lbs) 3 x 10  Standing Hip Abduction with 1 UE support  1 x 10  Side Lying Hip Abduction 3 x 10  Straight Leg Raise with Red Band 3 x 10   06/06/22: Nu-Step at seat 9 with resistance 2- 5 min Abdominal Reaches from [facility] lying Position 3 x 10  Lateral Step down on 4 inch step with 1 UE support 3 x 10 OMEGA Leg Press #85 3 x 10  Mini-Squat with #8 lbs with elbow extended to 22 inch mat height 3 x 10    05/30/22: Nu-Step at seat 9 with resistance 3- 5 min Mini-Squat to 27 inch mat height 1 x 10  Mini-Squat to 27 inch mat height with #8 lb DB 2 x 10 Supine Bent knee leg lift 3 x 30 sec  Supine Straight Leg 3 x 10  Standing Hip Abduction with 1 UE support 3 x 10    PATIENT EDUCATION:  Education details: form and technique for appropriate exercise and explanation about underlying lumbar spine pathology  Person educated: Patient Education method: Explanation, Demonstration, Verbal cues, and Handouts Education comprehension: verbalized understanding, returned  demonstration, and verbal cues required   HOME EXERCISE PROGRAM: Access Code: [email or web address] URL: [email or web address] Date: 06/09/2022 Prepared by: Bradly Chris  Exercises - Seated Hip External Rotation Stretch  - 1 x daily - 3 reps - 60 sec  hold - Prone Quadriceps Stretch with Strap  - 1 x daily - 3 reps - 30-60 sec hold - Supine Lower Trunk Rotation  - 1 x daily - 3 sets - 10 reps - Modified Thomas Stretch  - 1 x daily - 3 reps - 30-60 sec  hold - Seated Sciatic Tensioner  - 1 x daily - 1 sets - 10 reps - Mini Squat  - 3 x weekly - 3 sets - 10 reps -  Mini Squat with Chair  - 3 x weekly - 3 sets - 10 reps - Sidelying Hip Abduction  - 3 x weekly - 3 sets - 10 reps - Active Straight Leg Raise Advanced  - 3 x weekly - 3 sets - 10 reps   ASSESSMENT:   CLINICAL IMPRESSION: Pt shows some increase in her low back pain especially with hip hinges. Modified exercise to include decreased hip hinge with no resistance to avoid symptom, which patient did show symptom improvement. PT emphasized connection to functional tasks that patient expressed difficulty with in [place] such as reaching with standing bird dogs and bending with hip hinge. She will continue to benefit from skilled PT to improve hip strength and flexibility and to reduce low back pain and radicular symptoms to ambulate longer distances and stand for longer periods of time to care for her nieces and nephew and perform ADLs.   OBJECTIVE IMPAIRMENTS: decreased strength, impaired flexibility, impaired sensation, obesity, and pain.    ACTIVITY LIMITATIONS: carrying, lifting, bending, sitting, standing, sleeping, and locomotion level   PARTICIPATION LIMITATIONS: shopping, community activity, occupation, and child care   PERSONAL FACTORS: Fitness and Time since onset of injury/illness/exacerbation are also affecting patient's functional outcome.    REHAB POTENTIAL: Good   CLINICAL DECISION MAKING:  Stable/uncomplicated   EVALUATION COMPLEXITY: Low     GOALS: Goals reviewed with patient? No   SHORT TERM GOALS: Target date: 06/01/2022   Pt will be independent with HEP in order to improve strength and balance in order to decrease fall risk and improve function at home and work. Baseline: Performing exercise program independently  Goal status: Ongoing        LONG TERM GOALS: Target date: 07/27/2022   Patient will have improved function and activity level as evidenced by an increase in FOTO score by 10 points or more.  Baseline: 46/100 Goal status: Ongoing    2.  Patient will improve hip strength by 1/3 MMT (ie 4- to 4) to offload lumbar spinal structures and to increase lumbar stability for improved lumbar pain and symptom relief.  Baseline: Hip Ext R/L 4-/4-, Hip Abd R/L 4/4, Hip Add R/L 4-/4-  Goal status: Ongoing    3.  Patient will be able to maintain a neutral spine while performing >=10 mini-squats as evidence of good abdominal strength for increased spinal stability.  Baseline: Able to perform 10 mini-squats with a neutral spine  Goal status: Achieved   4. Patient will be able to perform a normal pelvic tilt and lower legs to 45, 30, 15 and 0 degrees without lumbar extension compensation as evidenced of improved abdominal strength.  Baseline: 4-; Able to lower to 30 and 0 degrees with good pelvic tilt  Goal status: Not Met        PLAN:   PT FREQUENCY: 1-2x/week   PT DURATION: 10 weeks   PLANNED INTERVENTIONS: Therapeutic exercises, Neuromuscular re-education, Gait training, Patient/Family education, Self Care, Joint mobilization, Joint manipulation, Aquatic Therapy, Dry Needling, Electrical stimulation, Spinal manipulation, Spinal mobilization, Cryotherapy, Moist heat, Ultrasound, Manual therapy, and Re-evaluation.   PLAN FOR NEXT SESSION:  TM Warm-up, Dead Bugs, and Progress Mini-Squats and Hip Hinge and attempt half kneel   Bradly Chris PT, DPT  06/14/2022,  9:43 AM

## 2022-06-16 ENCOUNTER — Ambulatory Visit: Payer: 59 | Admitting: Physical Therapy

## 2022-06-20 ENCOUNTER — Ambulatory Visit: Payer: 59 | Admitting: Physical Therapy

## 2022-06-20 DIAGNOSIS — M5459 Other low back pain: Secondary | ICD-10-CM

## 2022-06-20 DIAGNOSIS — G8929 Other chronic pain: Secondary | ICD-10-CM

## 2022-06-20 NOTE — Therapy (Signed)
OUTPATIENT PHYSICAL THERAPY TREATMENT NOTE   Patient Name: Tracey Cunningham MRN: [id number] DOB:November 19, 1980, 42 y.o., female Today's Date: 06/20/2022  PCP: Dr. Roxy MannsMarne Tower  REFERRING PROVIDER: Dr. Kerby NoraAmy Bedsole   END OF SESSION:   PT End of Session - 06/20/22 0942     Visit Number 8    Number of Visits 20    Date for PT Re-Evaluation 07/27/22    Authorization Type UHC 2024    Authorization Time Period 23 VISITS PER YEAR    Authorization - Visit Number 8    Authorization - Number of Visits 23    Progress Note Due on Visit 10    PT Start Time 0935    PT Stop Time 1015    PT Time Calculation (min) 40 min    Activity Tolerance Patient tolerated treatment well    [facility] assessed/performed              Past Medical History:  Diagnosis Date   Anemia    iron deficiency    Cerumen impaction    Family history of breast cancer    5/21 cancer genetic testing letter sent   Hypertension    borderline   Irregular periods    OBGYN- [place] side    Obesity    weight loss with diet and exercise    Past Surgical History:  Procedure Laterality Date   APPENDECTOMY  10/2005   CHOLECYSTECTOMY     foot fracture     right foot    Patient Active Problem List   Diagnosis Date Noted   Acute left-sided low back pain with left-sided sciatica 02/18/2022   Liver lesion 09/28/2020   Pulmonary nodule 08/23/2020   Lower abdominal pain 05/08/2020   Left groin pain 04/10/2020   Elevated liver transaminase level 01/01/2019   Lump on finger, right 10/10/2018   Family history of breast cancer in mother 02/02/2016   GERD (gastroesophageal reflux disease) 11/05/2014   Routine gynecological examination 01/31/2012   Routine general medical examination at a health care facility 01/31/2012   Cerumen impaction 05/14/2009   IRRITABLE BOWEL SYNDROME 04/23/2008   Morbid obesity ([facility]) 04/19/2007   ANEMIA-IRON DEFICIENCY 08/24/2006   Essential hypertension 08/24/2006    MENORRHALGIA 08/24/2006    REFERRING DIAG: Left sided low back pain   THERAPY DIAG:  Other low back pain  Chronic left-sided low back pain with sciatica, sciatica laterality unspecified  Rationale for Evaluation and Treatment Rehabilitation  PERTINENT HISTORY: Pt reports starting experiencing left sided low back pain about 3 years ago with an insidious onset. She reports pain that spreads from left side of back down to left hip and left groin and to medial side of ankle. She did physical therapy several years ago which did help relieve her symptoms. Patient has trouble standing for long periods of time and walking for long periods of time. She wants to be able to take her niece and nephew to aquarium, but cannot tolerate how much walking she has to do.   PRECAUTIONS: None   SUBJECTIVE:  SUBJECTIVE STATEMENT:  Pt reports increased symptoms in left leg over weekend on Friday that lasted until Saturday. She is unsure about what caused pain. She was able to do all the exercises without difficulty. Pain at onset of session is localized to left side of low back and it is not radiating down her left leg.   PAIN:  Are you having pain? Yes: NPRS scale: 1-2/10 Pain location: Left side low back pain that radiates down left groin  Pain description: Achy  Aggravating factors: Sitting for long periods of time  Relieving factors: Heat    OBJECTIVE: (objective measures completed at initial evaluation unless otherwise dated)  VITALS: BP146/89 HR 103 SpO2 98   DIAGNOSTIC FINDINGS:  CLINICAL DATA:  left sided low back pain with sciatica   EXAM: LUMBAR SPINE - COMPLETE 4+ VIEW   COMPARISON:  CT 05/26/2020   FINDINGS: There are 5 non-rib-bearing lumbar vertebrae. No evidence of lumbar spine fracture. There is  mild-to-moderate degenerative disc disease, most prominent at L3-L4, L4-L5, and L5-S1. There is mild lower lumbar predominant facet arthropathy. Alignment is normal.   IMPRESSION: Mild to moderate multilevel degenerative disc disease, most prominent at L3-L4, L4-L5, and L5-S1.   Mild lower lumbar predominant facet arthropathy.     Electronically Signed   By: Caprice Renshaw M.D.   On: 03/20/2022 10:26     PATIENT SURVEYS:  FOTO 46/100 with target of 63    SCREENING FOR RED FLAGS: Bowel or bladder incontinence: No Spinal tumors: No Cauda equina syndrome: No Compression fracture: No Abdominal aneurysm: No   COGNITION: Overall cognitive status: Within functional limits for tasks assessed                          SENSATION: Light touch: Impaired    MUSCLE LENGTH: Hamstrings: Right 90 deg; Left 90 deg Thomas test: Positive bilateral  Ely's Test: Positive Bilateral  Ober's Test: Negative Bilateral    POSTURE: No Significant postural limitations   PALPATION: Left sided L4 UPA TTP    LUMBAR ROM:    AROM eval  Flexion 100%  Extension 100%*  Right lateral flexion 100%  Left lateral flexion 100%  Right rotation 100%  Left rotation 100%   (Blank rows = not tested) *=Painful    LOWER EXTREMITY ROM:      Active  Right eval Left eval  Hip flexion      Hip extension      Hip abduction      Hip adduction      Hip internal rotation      Hip external rotation      Knee flexion      Knee extension      Ankle dorsiflexion      Ankle plantarflexion      Ankle inversion      Ankle eversion       (Blank rows = not tested)   LOWER EXTREMITY MMT:     MMT Right eval Left eval  Hip flexion 4+ 4+  Hip extension 4- 4-  Hip abduction 4 4  Hip adduction 4- 4-  Hip internal rotation      Hip external rotation      Knee flexion 4 4  Knee extension 4- 4-  Ankle dorsiflexion 5 5  Ankle plantarflexion      Ankle inversion      Ankle eversion       (Blank rows = not  tested)  LUMBAR SPECIAL TESTS:  Straight leg raise test: Negative, Slump test: Negative, FABER test: Positive, Thomas test: Positive, and FADIR test: Positive Left    FUNCTIONAL TESTS:  Squat- Able to perform mini-squat without difficulty    GAIT: Distance walked: 50 ft  Assistive device utilized: None Level of assistance: Complete Independence Comments: No gait deficits noted    TODAY'S TREATMENT:                                                                                                                              DATE:   06/20/22: TM with BUE support at 1.3 mph for 5 min  Dead Bugs 3 x 5  -min VC to increase abdominal contraction Side Lying Hip Abduction 2 x 10  Side Lying Hip Abduction with yellow band around ankles 3 x 10  Supine Bridges 1 x 10   06/14/22: Nu-Step at seat 9 with resistance 3- 5 min OMEGA Leg Press #95 with seat at 4 3 x 10  OMEGA cable Palloff Press #10 3 x 10  Standing Bird Dogs 3 x 10  -min VC for increased hip flexion  Standing Lat Dorsi Stretch 2 x 30 sec  Dead Lift with #10 KB with tap on 1 ft platform 1 x 10  -Pt reports increased pain in left side of low back  Hip Hinges to 45 degrees 2 x 10  -min VC to generate movement at hips  -Pt reports increased left sided low back pain symptoms   06/09/22: Nu-Step at seat 9 with resistance 2- 5 min Abdominal Reaches from [facility] lying Position with jug of water (8 lbs) 3 x 10  Standing Hip Abduction with 1 UE support  1 x 10  Side Lying Hip Abduction 3 x 10  Straight Leg Raise with Red Band 3 x 10    PATIENT EDUCATION:  Education details: form and technique for appropriate exercise and explanation about underlying lumbar spine pathology  Person educated: Patient Education method: Explanation, Demonstration, Verbal cues, and Handouts Education comprehension: verbalized understanding, returned demonstration, and verbal cues required   HOME EXERCISE PROGRAM: Access Code: [email or web address] URL:  [email or web address] Date: 06/20/2022 Prepared by: Ellin Goodie  Exercises - Seated Hip External Rotation Stretch  - 1 x daily - 3 reps - 60 sec  hold - Prone Quadriceps Stretch with Strap  - 1 x daily - 3 reps - 30-60 sec hold - Supine Lower Trunk Rotation  - 1 x daily - 3 sets - 10 reps - Modified Thomas Stretch  - 1 x daily - 3 reps - 30-60 sec  hold - Seated Sciatic Tensioner  - 1 x daily - 1 sets - 10 reps - Mini Squat with Chair  - 3 x weekly - 3 sets - 10 reps - Standing March with Alternating [facility]  - 3 x weekly - 3 sets - 10 reps - Latissimus Dorsi Stretch at Wall  - 1 x daily -  3 reps - 30 sec hold - Standing Hip Hinge  - 3 x weekly - 3 sets - 10 reps - Dead Bug  - 3 x weekly - 3 sets - 5 reps - Sidelying Hip Abduction with Resistance at Ankle  - 3 x weekly - 3 sets - 10 reps   ASSESSMENT:   CLINICAL IMPRESSION: Pt shows improvement with LE and abdominal strength with ability to perform strengthening with increased resistance. Despite strength gains, pt continues to experience increased low back pain with no clear pattern to aggravation. She will continue to benefit from skilled PT to improve hip strength and flexibility and to reduce low back pain and radicular symptoms to ambulate longer distances and stand for longer periods of time to care for her nieces and nephew and perform ADLs.   OBJECTIVE IMPAIRMENTS: decreased strength, impaired flexibility, impaired sensation, obesity, and pain.    ACTIVITY LIMITATIONS: carrying, lifting, bending, sitting, standing, sleeping, and locomotion level   PARTICIPATION LIMITATIONS: shopping, community activity, occupation, and child care   PERSONAL FACTORS: Fitness and Time since onset of injury/illness/exacerbation are also affecting patient's functional outcome.    REHAB POTENTIAL: Good   CLINICAL DECISION MAKING: Stable/uncomplicated   EVALUATION COMPLEXITY: Low     GOALS: Goals reviewed with patient?  No   SHORT TERM GOALS: Target date: 06/01/2022   Pt will be independent with HEP in order to improve strength and balance in order to decrease fall risk and improve function at home and work. Baseline: Performing exercise program independently  Goal status: Ongoing        LONG TERM GOALS: Target date: 07/27/2022   Patient will have improved function and activity level as evidenced by an increase in FOTO score by 10 points or more.  Baseline: 46/100 Goal status: Ongoing    2.  Patient will improve hip strength by 1/3 MMT (ie 4- to 4) to offload lumbar spinal structures and to increase lumbar stability for improved lumbar pain and symptom relief.  Baseline: Hip Ext R/L 4-/4-, Hip Abd R/L 4/4, Hip Add R/L 4-/4-  Goal status: Ongoing    3.  Patient will be able to maintain a neutral spine while performing >=10 mini-squats as evidence of good abdominal strength for increased spinal stability.  Baseline: Able to perform 10 mini-squats with a neutral spine  Goal status: Achieved   4. Patient will be able to perform a normal pelvic tilt and lower legs to 45, 30, 15 and 0 degrees without lumbar extension compensation as evidenced of improved abdominal strength.  Baseline: 4-; Able to lower to 30 and 0 degrees with good pelvic tilt  Goal status: Not Met        PLAN:   PT FREQUENCY: 1-2x/week   PT DURATION: 10 weeks   PLANNED INTERVENTIONS: Therapeutic exercises, Neuromuscular re-education, Gait training, Patient/Family education, Self Care, Joint mobilization, Joint manipulation, Aquatic Therapy, Dry Needling, Electrical stimulation, Spinal manipulation, Spinal mobilization, Cryotherapy, Moist heat, Ultrasound, Manual therapy, and Re-evaluation.   PLAN FOR NEXT SESSION: Circuits with walking and LE strengthening. Attempt half kneel.   Ellin Goodie PT, DPT  06/20/2022, 10:14 AM

## 2022-07-06 ENCOUNTER — Ambulatory Visit: Payer: 59 | Admitting: Physical Therapy

## 2022-07-12 ENCOUNTER — Ambulatory Visit: Payer: 59 | Admitting: Physical Therapy

## 2022-07-12 DIAGNOSIS — M5459 Other low back pain: Secondary | ICD-10-CM

## 2022-07-12 DIAGNOSIS — G8929 Other chronic pain: Secondary | ICD-10-CM

## 2022-07-12 NOTE — Therapy (Addendum)
OUTPATIENT PHYSICAL THERAPY TREATMENT NOTE   Patient Name: Tracey Cunningham MRN: [id number] DOB:15-May-1980, 42 y.o., female Today's Date: 07/12/2022  PCP: Dr. Roxy Manns  REFERRING PROVIDER: Dr. Kerby Nora   END OF SESSION:   PT End of Session - 07/12/22 1020     Visit Number 9    Number of Visits 20    Date for PT Re-Evaluation 07/27/22    Authorization Type UHC [id number]    Authorization Time Period 23 VISITS PER YEAR    Authorization - Visit Number 9    Authorization - Number of Visits 23    Progress Note Due on Visit 10    PT Start Time 1020    PT Stop Time 1100    PT Time Calculation (min) 40 min    Activity Tolerance Patient tolerated treatment well    Behavior During Therapy [facility] for tasks assessed/performed               Past Medical History:  Diagnosis Date   Anemia    iron deficiency    Cerumen impaction    Family history of breast cancer    5/21 cancer genetic testing letter sent   Hypertension    borderline   Irregular periods    OBGYN- [facility] side    Obesity    weight loss with diet and exercise    Past Surgical History:  Procedure Laterality Date   APPENDECTOMY  10/2005   CHOLECYSTECTOMY     foot fracture     right foot    Patient Active Problem List   Diagnosis Date Noted   Acute left-sided low back pain with left-sided sciatica 02/18/2022   Liver lesion 09/28/2020   Pulmonary nodule 08/23/2020   Lower abdominal pain 05/08/2020   Left groin pain 04/10/2020   Elevated liver transaminase level 01/01/2019   Lump on finger, right 10/10/2018   Family history of breast cancer in mother 02/02/2016   GERD (gastroesophageal reflux disease) 11/05/2014   Routine gynecological examination 01/31/2012   Routine general medical examination at a health care facility 01/31/2012   Cerumen impaction 05/14/2009   IRRITABLE BOWEL SYNDROME 04/23/2008   Morbid obesity (HCC) 04/19/2007   ANEMIA-IRON DEFICIENCY 08/24/2006   Essential hypertension 08/24/2006    MENORRHALGIA 08/24/2006    REFERRING DIAG: Left sided low back pain   THERAPY DIAG:  Other low back pain  Chronic left-sided low back pain with sciatica, sciatica laterality unspecified  Rationale for Evaluation and Treatment Rehabilitation  PERTINENT HISTORY: Pt reports starting experiencing left sided low back pain about 3 years ago with an insidious onset. She reports pain that spreads from left side of back down to left hip and left groin and to medial side of ankle. She did physical therapy several years ago which did help relieve her symptoms. Patient has trouble standing for long periods of time and walking for long periods of time. She wants to be able to take her niece and nephew to aquarium, but cannot tolerate how much walking she has to do.   PRECAUTIONS: None   SUBJECTIVE:  SUBJECTIVE STATEMENT:  Pt states that she her back is feeling better since last session which was several weeks ago. She has recently started walking program called [employer]  on her phone for 8 to 10 min per day.   PAIN:  Are you having pain? No   OBJECTIVE: (objective measures completed at initial evaluation unless otherwise dated)  VITALS: BP146/89 HR 103 SpO2 98   DIAGNOSTIC FINDINGS:  CLINICAL DATA:  left sided low back pain with sciatica   EXAM: LUMBAR SPINE - COMPLETE 4+ VIEW   COMPARISON:  CT 05/26/2020   FINDINGS: There are 5 non-rib-bearing lumbar vertebrae. No evidence of lumbar spine fracture. There is mild-to-moderate degenerative disc disease, most prominent at L3-L4, L4-L5, and L5-S1. There is mild lower lumbar predominant facet arthropathy. Alignment is normal.   IMPRESSION: Mild to moderate multilevel degenerative disc disease, most prominent at L3-L4, L4-L5, and L5-S1.   Mild lower lumbar  predominant facet arthropathy.     Electronically Signed   By: Caprice Renshaw M.D.   On: 03/20/2022 10:26     PATIENT SURVEYS:  FOTO 46/100 with target of 63    SCREENING FOR RED FLAGS: Bowel or bladder incontinence: No Spinal tumors: No Cauda equina syndrome: No Compression fracture: No Abdominal aneurysm: No   COGNITION: Overall cognitive status: Within functional limits for tasks assessed                          SENSATION: Light touch: Impaired    MUSCLE LENGTH: Hamstrings: Right 90 deg; Left 90 deg Thomas test: Positive bilateral  Ely's Test: Positive Bilateral  Ober's Test: Negative Bilateral    POSTURE: No Significant postural limitations   PALPATION: Left sided L4 UPA TTP    LUMBAR ROM:    AROM eval  Flexion 100%  Extension 100%*  Right lateral flexion 100%  Left lateral flexion 100%  Right rotation 100%  Left rotation 100%   (Blank rows = not tested) *=Painful    LOWER EXTREMITY ROM:      Active  Right eval Left eval  Hip flexion      Hip extension      Hip abduction      Hip adduction      Hip internal rotation      Hip external rotation      Knee flexion      Knee extension      Ankle dorsiflexion      Ankle plantarflexion      Ankle inversion      Ankle eversion       (Blank rows = not tested)   LOWER EXTREMITY MMT:     MMT Right eval Left eval  Hip flexion 4+ 4+  Hip extension 4- 4-  Hip abduction 4 4  Hip adduction 4- 4-  Hip internal rotation      Hip external rotation      Knee flexion 4 4  Knee extension 4- 4-  Ankle dorsiflexion 5 5  Ankle plantarflexion      Ankle inversion      Ankle eversion       (Blank rows = not tested)   LUMBAR SPECIAL TESTS:  Straight leg raise test: Negative, Slump test: Negative, FABER test: Positive, Thomas test: Positive, and FADIR test: Positive Left    FUNCTIONAL TESTS:  Squat- Able to perform mini-squat without difficulty    GAIT: Distance walked: 50 ft  Assistive device  utilized: None Level of  assistance: Complete Independence Comments: No gait deficits noted    TODAY'S TREATMENT:                                                                                                                              DATE:   07/12/22: TM with BUE support at 1.3 mph for 5 min  FOTO: 60  Dead Bug 1 x 10  Dead Bug with #2 AW 1 x 10  Side Lying Red Band 3 x 10  Mini-Squat with #8 lb x 2 to 22 inch 3 x 10  Seated Hip External Rotation Stretch 3 x 30 sec  Lower Trunk Rotation 3 x 10 x 3 sec hold    06/20/22: TM with BUE support at 1.3 mph for 5 min  Dead Bugs 3 x 5  -min VC to increase abdominal contraction Side Lying Hip Abduction 2 x 10  Side Lying Hip Abduction with yellow band around ankles 3 x 10  Supine Bridges 1 x 10   06/14/22: Nu-Step at seat 9 with resistance 3- 5 min OMEGA Leg Press #95 with seat at 4 3 x 10  OMEGA cable Palloff Press #10 3 x 10  Standing Bird Dogs 3 x 10  -min VC for increased hip flexion  Standing Lat Dorsi Stretch 2 x 30 sec  Dead Lift with #10 KB with tap on 1 ft platform 1 x 10  -Pt reports increased pain in left side of low back  Hip Hinges to 45 degrees 2 x 10  -min VC to generate movement at hips  -Pt reports increased left sided low back pain symptoms   PATIENT EDUCATION:  Education details: form and technique for appropriate exercise and explanation about underlying lumbar spine pathology  Person educated: Patient Education method: Explanation, Demonstration, Verbal cues, and Handouts Education comprehension: verbalized understanding, returned demonstration, and verbal cues required   HOME EXERCISE PROGRAM: Access Code: [email or web address] URL: [email or web address] Date: 07/12/2022 Prepared by: Ellin Goodie  Exercises - Seated Hip External Rotation Stretch  - 1 x daily - 3 reps - 30 sec  hold - Prone Quadriceps Stretch with Strap  - 1 x daily - 3 reps - 30-60 sec hold - Supine Lower Trunk Rotation  - 1 x daily  - 3 sets - 10 reps - Modified Thomas Stretch  - 1 x daily - 3 reps - 30-60 sec  hold - Seated Sciatic Tensioner  - 1 x daily - 1 sets - 10 reps - Mini Squat with Chair  - 3 x weekly - 3 sets - 10 reps - Standing March with Alternating [facility]  - 3 x weekly - 3 sets - 10 reps - Latissimus Dorsi Stretch at Wall  - 1 x daily - 3 reps - 30 sec hold - Dead Bug  - 3 x weekly - 3 sets - 5 reps - Sidelying Hip Abduction with Resistance at Ankle  - 3 x weekly -  3 sets - 10 reps   ASSESSMENT:   CLINICAL IMPRESSION: Pt continues to show improvement with LE and abdominal strength with ability to perform exercises with increased resistance with dead bugs with ankle weights and mini-squats with increased weight. She also demonstrates improved perception of low back function as evidenced by improvement in FOTO score. She will continue to benefit from skilled PT to improve hip strength and flexibility and to reduce low back pain and radicular symptoms to ambulate longer distances and stand for longer periods of time to care for her nieces and nephew and perform ADLs.   OBJECTIVE IMPAIRMENTS: decreased strength, impaired flexibility, impaired sensation, obesity, and pain.    ACTIVITY LIMITATIONS: carrying, lifting, bending, sitting, standing, sleeping, and locomotion level   PARTICIPATION LIMITATIONS: shopping, community activity, occupation, and child care   PERSONAL FACTORS: Fitness and Time since onset of injury/illness/exacerbation are also affecting patient's functional outcome.    REHAB POTENTIAL: Good   CLINICAL DECISION MAKING: Stable/uncomplicated   EVALUATION COMPLEXITY: Low     GOALS: Goals reviewed with patient? No   SHORT TERM GOALS: Target date: 06/01/2022   Pt will be independent with HEP in order to improve strength and balance in order to decrease fall risk and improve function at home and work. Baseline: Performing exercise program independently  Goal status: Ongoing         LONG TERM GOALS: Target date: 07/27/2022   Patient will have improved function and activity level as evidenced by an increase in FOTO score by 10 points or more.  Baseline: 46/100  07/12/22: 60/100 Goal status: Achieved    2.  Patient will improve hip strength by 1/3 MMT (ie 4- to 4) to offload lumbar spinal structures and to increase lumbar stability for improved lumbar pain and symptom relief.  Baseline: Hip Ext R/L 4-/4-, Hip Abd R/L 4/4, Hip Add R/L 4-/4-  Goal status: Ongoing    3.  Patient will be able to maintain a neutral spine while performing >=10 mini-squats as evidence of good abdominal strength for increased spinal stability.  Baseline: Able to perform 10 mini-squats with a neutral spine  Goal status: Achieved   4. Patient will be able to perform a normal pelvic tilt and lower legs to 45, 30, 15 and 0 degrees without lumbar extension compensation as evidenced of improved abdominal strength.  Baseline: 4-; Able to lower to 30 and 0 degrees with good pelvic tilt  Goal status: Not Met        PLAN:   PT FREQUENCY: 1-2x/week   PT DURATION: 10 weeks   PLANNED INTERVENTIONS: Therapeutic exercises, Neuromuscular re-education, Gait training, Patient/Family education, Self Care, Joint mobilization, Joint manipulation, Aquatic Therapy, Dry Needling, Electrical stimulation, Spinal manipulation, Spinal mobilization, Cryotherapy, Moist heat, Ultrasound, Manual therapy, and Re-evaluation.   PLAN FOR NEXT SESSION: Reassess goals. Circuits with walking and LE strengthening.    Ellin Goodie PT, DPT  07/12/2022, 11:02 AM

## 2022-07-19 ENCOUNTER — Ambulatory Visit: Payer: 59 | Admitting: Physical Therapy

## 2022-07-25 ENCOUNTER — Ambulatory Visit: Payer: 59 | Admitting: Physical Therapy

## 2022-07-28 ENCOUNTER — Encounter: Payer: Self-pay | Admitting: Physical Therapy

## 2022-07-28 ENCOUNTER — Ambulatory Visit: Payer: 59 | Attending: Family Medicine

## 2022-07-28 DIAGNOSIS — G8929 Other chronic pain: Secondary | ICD-10-CM | POA: Diagnosis present

## 2022-07-28 DIAGNOSIS — M5459 Other low back pain: Secondary | ICD-10-CM | POA: Insufficient documentation

## 2022-07-28 DIAGNOSIS — M5440 Lumbago with sciatica, unspecified side: Secondary | ICD-10-CM | POA: Insufficient documentation

## 2022-07-28 NOTE — Therapy (Addendum)
OUTPATIENT PHYSICAL THERAPY PROGRESS & DISCHARGE NOTE    Discharge Summary: Pt has not seen a significant change in her low back pain since starting PT. PT recommending that pt seek additional eval and treatment and to discontinue PT.   Patient Name: Tracey Cunningham MRN: [id number] DOB:May 26, 1980, 42 y.o., female Today's Date: 07/28/2022  PCP: Dr. Roxy Manns  REFERRING PROVIDER: Dr. Kerby Nora   END OF SESSION:   PT End of Session - 07/28/22 0901     Visit Number 10    Number of Visits 20    Date for PT Re-Evaluation 09/08/22    Authorization Type UHC 2024    Authorization Time Period 23 VISITS PER YEAR    Authorization - Visit Number 10    Authorization - Number of Visits 23    Progress Note Due on Visit 10    PT Start Time 0901    PT Stop Time 0944    PT Time Calculation (min) 43 min    Activity Tolerance Patient tolerated treatment well    Behavior During Therapy [facility] for tasks assessed/performed                Past Medical History:  Diagnosis Date   Anemia    iron deficiency    Cerumen impaction    Family history of breast cancer    5/21 cancer genetic testing letter sent   Hypertension    borderline   Irregular periods    OBGYN- [place] [facility]    Obesity    weight loss with diet and exercise    Past Surgical History:  Procedure Laterality Date   APPENDECTOMY  10/2005   CHOLECYSTECTOMY     foot fracture     right foot    Patient Active Problem List   Diagnosis Date Noted   Acute left-sided low back pain with left-sided sciatica 02/18/2022   Liver lesion 09/28/2020   Pulmonary nodule 08/23/2020   Lower abdominal pain 05/08/2020   Left groin pain 04/10/2020   Elevated liver transaminase level 01/01/2019   Lump on finger, right 10/10/2018   Family history of breast cancer in mother 02/02/2016   GERD (gastroesophageal reflux disease) 11/05/2014   Routine gynecological examination 01/31/2012   Routine general medical examination at a health care  facility 01/31/2012   Cerumen impaction 05/14/2009   IRRITABLE BOWEL SYNDROME 04/23/2008   Morbid obesity (HCC) 04/19/2007   ANEMIA-IRON DEFICIENCY 08/24/2006   Essential hypertension 08/24/2006   MENORRHALGIA 08/24/2006    REFERRING DIAG: Left sided low back pain   THERAPY DIAG:  Other low back pain - Plan: PT plan of care cert/re-cert  Chronic left-sided low back pain with sciatica, sciatica laterality unspecified - Plan: PT plan of care cert/re-cert  Rationale for Evaluation and Treatment Rehabilitation  PERTINENT HISTORY: Pt reports starting experiencing left sided low back pain about 3 years ago with an insidious onset. She reports pain that spreads from left side of back down to left hip and left groin and to medial side of ankle. She did physical therapy several years ago which did help relieve her symptoms. Patient has trouble standing for long periods of time and walking for long periods of time. She wants to be able to take her niece and nephew to aquarium, but cannot tolerate how much walking she has to do.   PRECAUTIONS: None   SUBJECTIVE:  SUBJECTIVE STATEMENT:  Patient reports pain this morning in her back that started on her way to PT appointment. Denies any flare ups since last session. Reports completing HEP independently each day.   PAIN:  Are you having pain? No   OBJECTIVE: (objective measures completed at initial evaluation unless otherwise dated)  VITALS: BP146/89 HR 103 SpO2 98   DIAGNOSTIC FINDINGS:  CLINICAL DATA:  left sided low back pain with sciatica   EXAM: LUMBAR SPINE - COMPLETE 4+ VIEW   COMPARISON:  CT 05/26/2020   FINDINGS: There are 5 non-rib-bearing lumbar vertebrae. No evidence of lumbar spine fracture. There is mild-to-moderate degenerative disc  disease, most prominent at L3-L4, L4-L5, and L5-S1. There is mild lower lumbar predominant facet arthropathy. Alignment is normal.   IMPRESSION: Mild to moderate multilevel degenerative disc disease, most prominent at L3-L4, L4-L5, and L5-S1.   Mild lower lumbar predominant facet arthropathy.     Electronically Signed   By: Caprice Renshaw M.D.   On: 03/20/2022 10:26     PATIENT SURVEYS:  FOTO 46/100 with target of 63    SCREENING FOR RED FLAGS: Bowel or bladder incontinence: No Spinal tumors: No Cauda equina syndrome: No Compression fracture: No Abdominal aneurysm: No   COGNITION: Overall cognitive status: Within functional limits for tasks assessed                          SENSATION: Light touch: Impaired    MUSCLE LENGTH: Hamstrings: Right 90 deg; Left 90 deg Thomas test: Positive bilateral  Ely's Test: Positive Bilateral  Ober's Test: Negative Bilateral    POSTURE: No Significant postural limitations   PALPATION: Left sided L4 UPA TTP    LUMBAR ROM:    AROM eval  Flexion 100%  Extension 100%*  Right lateral flexion 100%  Left lateral flexion 100%  Right rotation 100%  Left rotation 100%   (Blank rows = not tested) *=Painful    LOWER EXTREMITY ROM:      Active  Right eval Left eval  Hip flexion      Hip extension      Hip abduction      Hip adduction      Hip internal rotation      Hip external rotation      Knee flexion      Knee extension      Ankle dorsiflexion      Ankle plantarflexion      Ankle inversion      Ankle eversion       (Blank rows = not tested)   LOWER EXTREMITY MMT:     MMT Right eval Left eval  Hip flexion 4+ 4+  Hip extension 4- 4-  Hip abduction 4 4  Hip adduction 4- 4-  Hip internal rotation      Hip external rotation      Knee flexion 4 4  Knee extension 4- 4-  Ankle dorsiflexion 5 5  Ankle plantarflexion      Ankle inversion      Ankle eversion       (Blank rows = not tested)   LUMBAR SPECIAL TESTS:   Straight leg raise test: Negative, Slump test: Negative, FABER test: Positive, Thomas test: Positive, and FADIR test: Positive Left    FUNCTIONAL TESTS:  Squat- Able to perform mini-squat without difficulty    GAIT: Distance walked: 50 ft  Assistive device utilized: None Level of assistance: Complete Independence Comments: No gait deficits  noted    TODAY'S TREATMENT:                                                                                                                              DATE:  07/28/22 Physical therapy treatment session today consisted of completing assessment of goals and administration of testing as demonstrated and documented in flow sheet, treatment, and goals section of this note. Addition treatments may be found below.   TM with B UE support at 1.3 mph for 5 mins  Supine TA activation with Ues on Red physioball x 10 with 3 second hold Supine TA activation Ues on Red physioball with alternating UE flexion x 10  Dead Bug x 10  Supine bridges 2 x 10 Lower trunk rotation x 15 with 3 second hold  Paloff press seated BTB x 15 each side Paloff press standing BTB x 15 each side   07/12/22: TM with BUE support at 1.3 mph for 5 min  FOTO: 60  Dead Bug 1 x 10  Dead Bug with #2 AW 1 x 10  Side Lying Red Band 3 x 10  Mini-Squat with #8 lb x 2 to 22 inch 3 x 10  Seated Hip External Rotation Stretch 3 x 30 sec  Lower Trunk Rotation 3 x 10 x 3 sec hold    06/20/22: TM with BUE support at 1.3 mph for 5 min  Dead Bugs 3 x 5  -min VC to increase abdominal contraction Side Lying Hip Abduction 2 x 10  Side Lying Hip Abduction with yellow band around ankles 3 x 10  Supine Bridges 1 x 10   06/14/22: Nu-Step at seat 9 with resistance 3- 5 min OMEGA Leg Press #95 with seat at 4 3 x 10  OMEGA cable Palloff Press #10 3 x 10  Standing Bird Dogs 3 x 10  -min VC for increased hip flexion  Standing Lat Dorsi Stretch 2 x 30 sec  Dead Lift with #10 KB with tap on 1 ft platform  1 x 10  -Pt reports increased pain in left side of low back  Hip Hinges to 45 degrees 2 x 10  -min VC to generate movement at hips  -Pt reports increased left sided low back pain symptoms   PATIENT EDUCATION:  Education details: form and technique for appropriate exercise and explanation about underlying lumbar spine pathology  Person educated: Patient Education method: Explanation, Demonstration, Verbal cues, and Handouts Education comprehension: verbalized understanding, returned demonstration, and verbal cues required   HOME EXERCISE PROGRAM: Access Code: [email or web address] URL: [email or web address] Date: 07/12/2022 Prepared by: Ellin Goodie  Exercises - Seated Hip External Rotation Stretch  - 1 x daily - 3 reps - 30 sec  hold - Prone Quadriceps Stretch with Strap  - 1 x daily - 3 reps - 30-60 sec hold - Supine Lower Trunk Rotation  - 1 x daily - 3 sets - 10 reps - Modified Erby Pian  -  1 x daily - 3 reps - 30-60 sec  hold - Seated Sciatic Tensioner  - 1 x daily - 1 sets - 10 reps - Mini Squat with Chair  - 3 x weekly - 3 sets - 10 reps - Standing March with Alternating [facility]  - 3 x weekly - 3 sets - 10 reps - Latissimus Dorsi Stretch at Wall  - 1 x daily - 3 reps - 30 sec hold - Dead Bug  - 3 x weekly - 3 sets - 5 reps - Sidelying Hip Abduction with Resistance at Ankle  - 3 x weekly - 3 sets - 10 reps   ASSESSMENT:   CLINICAL IMPRESSION:  Patient showing progress towards physical therapy goals. Patient has met 2/5 goals thus far. Reduction in FOTO score this session as patient attempted activities that she had not previously completed and reported increased difficulty compared to what she predicted. Continues to demonstrate core weakness and reports of back pain at a consistent level. Education provided to patient about core activation during every day activities in hopes to reduce stress on low back. Patient will continue to benefit from skilled therapy to  address remaining deficits in order to improve quality of life and return to PLOF.    OBJECTIVE IMPAIRMENTS: decreased strength, impaired flexibility, impaired sensation, obesity, and pain.    ACTIVITY LIMITATIONS: carrying, lifting, bending, sitting, standing, sleeping, and locomotion level   PARTICIPATION LIMITATIONS: shopping, community activity, occupation, and child care   PERSONAL FACTORS: Fitness and Time since onset of injury/illness/exacerbation are also affecting patient's functional outcome.    REHAB POTENTIAL: Good   CLINICAL DECISION MAKING: Stable/uncomplicated   EVALUATION COMPLEXITY: Low     GOALS: Goals reviewed with patient? No   SHORT TERM GOALS: Target date: 06/01/2022   Pt will be independent with HEP in order to improve strength and balance in order to decrease fall risk and improve function at home and work. Baseline: Performing exercise program independently  Goal status: MET        LONG TERM GOALS: Target date: 07/27/2022   Patient will have improved function and activity level as evidenced by an increase in FOTO score by 10 points or more.  Baseline: 46/100  07/12/22: 60/100 5/16: 51/100 - patient reports difficulty with prolonged standing 45 minutes-1 hour with intermittent breaks.  Goal status: Not Met    2.  Patient will improve hip strength by 1/3 MMT (ie 4- to 4) to offload lumbar spinal structures and to increase lumbar stability for improved lumbar pain and symptom relief.  Baseline: Hip Ext R/L 4-/4-, Hip Abd R/L 4/4, Hip Add R/L 4-/4-; 5/16: hip abduction 4+ bilaterally, hip adduction 4+ bilaterally, hip ext  Goal status: Not Met    3.  Patient will be able to maintain a neutral spine while performing >=10 mini-squats as evidence of good abdominal strength for increased spinal stability.  Baseline: Able to perform 10 mini-squats with a neutral spine  Goal status: Achieved   4. Patient will be able to perform a normal pelvic tilt and lower legs  to 45, 30, 15 and 0 degrees without lumbar extension compensation as evidenced of improved abdominal strength.  Baseline: 4-; Able to lower to 30 and 0 degrees with good pelvic tilt; 5/16: able to lower 0, 30 degrees, however 45 degrees begins to anterior pelvic tilt Goal status: Not Met        PLAN:   PT FREQUENCY: 1-2x/week   PT DURATION: 10 weeks  PLANNED INTERVENTIONS: Therapeutic exercises, Neuromuscular re-education, Gait training, Patient/Family education, Self Care, Joint mobilization, Joint manipulation, Aquatic Therapy, Dry Needling, Electrical stimulation, Spinal manipulation, Spinal mobilization, Cryotherapy, Moist heat, Ultrasound, Manual therapy, and Re-evaluation.   PLAN FOR NEXT SESSION: Discharge from PT     Maylon Peppers, PT, DPT Physical Therapist - [facility]  07/28/2022, 12:20 PM

## 2022-08-01 ENCOUNTER — Encounter: Payer: Self-pay | Admitting: Family Medicine

## 2022-08-02 ENCOUNTER — Ambulatory Visit: Payer: 59 | Admitting: Physical Therapy

## 2022-08-02 DIAGNOSIS — M5459 Other low back pain: Secondary | ICD-10-CM

## 2022-08-02 DIAGNOSIS — G8929 Other chronic pain: Secondary | ICD-10-CM

## 2022-08-02 NOTE — Therapy (Signed)
OUTPATIENT PHYSICAL THERAPY PROGRESS & TREATMENT NOTE   Patient Name: Tracey Cunningham MRN: [id number] DOB:[birth year], 42 y.o., female Today's Date: 08/02/2022  PCP: Dr. Roxy Manns  REFERRING PROVIDER: Dr. Kerby Nora   END OF SESSION:   PT End of Session - 08/02/22 1026     Visit Number 10    Number of Visits 20    Date for PT Re-Evaluation 09/08/22    Authorization Type [employer] [id number]    Authorization Time Period 23 VISITS PER YEAR    Authorization - Number of Visits 23    Progress Note Due on Visit 10    PT Start Time 0945    PT Stop Time 0950    PT Time Calculation (min) 5 min    Activity Tolerance Patient tolerated treatment well    Behavior During Therapy [facility] for tasks assessed/performed                 Past Medical History:  Diagnosis Date   Anemia    iron deficiency    Cerumen impaction    Family history of breast cancer    5/21 cancer genetic testing letter sent   Hypertension    borderline   Irregular periods    OBGYN- [place]    Obesity    weight loss with diet and exercise    Past Surgical History:  Procedure Laterality Date   APPENDECTOMY  10/2005   CHOLECYSTECTOMY     foot fracture     right foot    Patient Active Problem List   Diagnosis Date Noted   Acute left-sided low back pain with left-sided sciatica 02/18/2022   Liver lesion 09/28/2020   Pulmonary nodule 08/23/2020   Lower abdominal pain 05/08/2020   Left groin pain 04/10/2020   Elevated liver transaminase level 01/01/2019   Lump on finger, right 10/10/2018   Family history of breast cancer in mother 02/02/2016   GERD (gastroesophageal reflux disease) 11/05/2014   Routine gynecological examination 01/31/2012   Routine general medical examination at a health care facility 01/31/2012   Cerumen impaction 05/14/2009   IRRITABLE BOWEL SYNDROME 04/23/2008   Morbid obesity (HCC) 04/19/2007   ANEMIA-IRON DEFICIENCY 08/24/2006   Essential hypertension 08/24/2006   MENORRHALGIA  08/24/2006    REFERRING DIAG: Left sided low back pain   THERAPY DIAG:  Other low back pain  Chronic left-sided low back pain with sciatica, sciatica laterality unspecified  Rationale for Evaluation and Treatment Rehabilitation  PERTINENT HISTORY: Pt reports starting experiencing left sided low back pain about 3 years ago with an insidious onset. She reports pain that spreads from left side of back down to left hip and left groin and to medial side of ankle. She did physical therapy several years ago which did help relieve her symptoms. Patient has trouble standing for long periods of time and walking for long periods of time. She wants to be able to take her niece and nephew to aquarium, but cannot tolerate how much walking she has to do.   PRECAUTIONS: None   SUBJECTIVE:  SUBJECTIVE STATEMENT:  Pt returns with ongoing pain in low back that has not improved since start of PT.   PAIN:  Are you having pain? No   OBJECTIVE: (objective measures completed at initial evaluation unless otherwise dated)  VITALS: BP146/89 HR 103 SpO2 98   DIAGNOSTIC FINDINGS:  CLINICAL DATA:  left sided low back pain with sciatica   EXAM: LUMBAR SPINE - COMPLETE 4+ VIEW   COMPARISON:  CT 05/26/2020   FINDINGS: There are 5 non-rib-bearing lumbar vertebrae. No evidence of lumbar spine fracture. There is mild-to-moderate degenerative disc disease, most prominent at L3-L4, L4-L5, and L5-S1. There is mild lower lumbar predominant facet arthropathy. Alignment is normal.   IMPRESSION: Mild to moderate multilevel degenerative disc disease, most prominent at L3-L4, L4-L5, and L5-S1.   Mild lower lumbar predominant facet arthropathy.     Electronically Signed   By: Caprice Renshaw M.D.   On: 03/20/2022 10:26     PATIENT  SURVEYS:  FOTO 46/100 with target of 63    SCREENING FOR RED FLAGS: Bowel or bladder incontinence: No Spinal tumors: No Cauda equina syndrome: No Compression fracture: No Abdominal aneurysm: No   COGNITION: Overall cognitive status: Within functional limits for tasks assessed                          SENSATION: Light touch: Impaired    MUSCLE LENGTH: Hamstrings: Right 90 deg; Left 90 deg Thomas test: Positive bilateral  Ely's Test: Positive Bilateral  Ober's Test: Negative Bilateral    POSTURE: No Significant postural limitations   PALPATION: Left sided L4 UPA TTP    LUMBAR ROM:    AROM eval  Flexion 100%  Extension 100%*  Right lateral flexion 100%  Left lateral flexion 100%  Right rotation 100%  Left rotation 100%   (Blank rows = not tested) *=Painful    LOWER EXTREMITY ROM:      Active  Right eval Left eval  Hip flexion      Hip extension      Hip abduction      Hip adduction      Hip internal rotation      Hip external rotation      Knee flexion      Knee extension      Ankle dorsiflexion      Ankle plantarflexion      Ankle inversion      Ankle eversion       (Blank rows = not tested)   LOWER EXTREMITY MMT:     MMT Right eval Left eval  Hip flexion 4+ 4+  Hip extension 4- 4-  Hip abduction 4 4  Hip adduction 4- 4-  Hip internal rotation      Hip external rotation      Knee flexion 4 4  Knee extension 4- 4-  Ankle dorsiflexion 5 5  Ankle plantarflexion      Ankle inversion      Ankle eversion       (Blank rows = not tested)   LUMBAR SPECIAL TESTS:  Straight leg raise test: Negative, Slump test: Negative, FABER test: Positive, Thomas test: Positive, and FADIR test: Positive Left    FUNCTIONAL TESTS:  Squat- Able to perform mini-squat without difficulty    GAIT: Distance walked: 50 ft  Assistive device utilized: None Level of assistance: Complete Independence Comments: No gait deficits noted    TODAY'S TREATMENT:  DATE:  08/02/22: Discussed treatment options   07/28/22 Physical therapy treatment session today consisted of completing assessment of goals and administration of testing as demonstrated and documented in flow sheet, treatment, and goals section of this note. Addition treatments may be found below.   TM with B UE support at 1.3 mph for 5 mins  Supine TA activation with Ues on Red physioball x 10 with 3 second hold Supine TA activation Ues on Red physioball with alternating UE flexion x 10  Dead Bug x 10  Supine bridges 2 x 10 Lower trunk rotation x 15 with 3 second hold  Paloff press seated BTB x 15 each side Paloff press standing BTB x 15 each side   07/12/22: TM with BUE support at 1.3 mph for 5 min  FOTO: 60  Dead Bug 1 x 10  Dead Bug with #2 AW 1 x 10  Side Lying Red Band 3 x 10  Mini-Squat with #8 lb x 2 to 22 inch 3 x 10  Seated Hip External Rotation Stretch 3 x 30 sec  Lower Trunk Rotation 3 x 10 x 3 sec hold    06/20/22: TM with BUE support at 1.3 mph for 5 min  Dead Bugs 3 x 5  -min VC to increase abdominal contraction Side Lying Hip Abduction 2 x 10  Side Lying Hip Abduction with yellow band around ankles 3 x 10  Supine Bridges 1 x 10   06/14/22: Nu-Step at seat 9 with resistance 3- 5 min OMEGA Leg Press #95 with seat at 4 3 x 10  OMEGA cable Palloff Press #10 3 x 10  Standing Bird Dogs 3 x 10  -min VC for increased hip flexion  Standing Lat Dorsi Stretch 2 x 30 sec  Dead Lift with #10 KB with tap on 1 ft platform 1 x 10  -Pt reports increased pain in left side of low back  Hip Hinges to 45 degrees 2 x 10  -min VC to generate movement at hips  -Pt reports increased left sided low back pain symptoms   PATIENT EDUCATION:  Education details: form and technique for appropriate exercise and explanation about underlying lumbar spine pathology  Person educated:  Patient Education method: Explanation, Demonstration, Verbal cues, and Handouts Education comprehension: verbalized understanding, returned demonstration, and verbal cues required   HOME EXERCISE PROGRAM: Access Code: [email or web address] URL: [email or web address] Date: 07/12/2022 Prepared by: Ellin Goodie  Exercises - Seated Hip External Rotation Stretch  - 1 x daily - 3 reps - 30 sec  hold - Prone Quadriceps Stretch with Strap  - 1 x daily - 3 reps - 30-60 sec hold - Supine Lower Trunk Rotation  - 1 x daily - 3 sets - 10 reps - Modified Thomas Stretch  - 1 x daily - 3 reps - 30-60 sec  hold - Seated Sciatic Tensioner  - 1 x daily - 1 sets - 10 reps - Mini Squat with Chair  - 3 x weekly - 3 sets - 10 reps - Standing March with Alternating Med [facility]  - 3 x weekly - 3 sets - 10 reps - Latissimus Dorsi Stretch at Wall  - 1 x daily - 3 reps - 30 sec hold - Dead Bug  - 3 x weekly - 3 sets - 5 reps - Sidelying Hip Abduction with Resistance at Ankle  - 3 x weekly - 3 sets - 10 reps   ASSESSMENT:   CLINICAL IMPRESSION:  Patient would like to  seek additional eval and treatment from medical team and to discharge from PT.    OBJECTIVE IMPAIRMENTS: decreased strength, impaired flexibility, impaired sensation, obesity, and pain.    ACTIVITY LIMITATIONS: carrying, lifting, bending, sitting, standing, sleeping, and locomotion level   PARTICIPATION LIMITATIONS: shopping, community activity, occupation, and child care   PERSONAL FACTORS: Fitness and Time since onset of injury/illness/exacerbation are also affecting patient's functional outcome.    REHAB POTENTIAL: Good   CLINICAL DECISION MAKING: Stable/uncomplicated   EVALUATION COMPLEXITY: Low     GOALS: Goals reviewed with patient? No   SHORT TERM GOALS: Target date: 06/01/2022   Pt will be independent with HEP in order to improve strength and balance in order to decrease fall risk and improve function at home and  work. Baseline: Performing exercise program independently  Goal status: MET        LONG TERM GOALS: Target date: 07/27/2022   Patient will have improved function and activity level as evidenced by an increase in FOTO score by 10 points or more.  Baseline: 46/100  07/12/22: 60/100 5/16: 51/100 - patient reports difficulty with prolonged standing 45 minutes-1 hour with intermittent breaks.  Goal status: Ongoing     2.  Patient will improve hip strength by 1/3 MMT (ie 4- to 4) to offload lumbar spinal structures and to increase lumbar stability for improved lumbar pain and symptom relief.  Baseline: Hip Ext R/L 4-/4-, Hip Abd R/L 4/4, Hip Add R/L 4-/4-; 5/16: hip abduction 4+ bilaterally, hip adduction 4+ bilaterally, hip ext  Goal status: Ongoing    3.  Patient will be able to maintain a neutral spine while performing >=10 mini-squats as evidence of good abdominal strength for increased spinal stability.  Baseline: Able to perform 10 mini-squats with a neutral spine  Goal status: Achieved   4. Patient will be able to perform a normal pelvic tilt and lower legs to 45, 30, 15 and 0 degrees without lumbar extension compensation as evidenced of improved abdominal strength.  Baseline: 4-; Able to lower to 30 and 0 degrees with good pelvic tilt; 5/16: able to lower 0, 30 degrees, however 45 degrees begins to anterior pelvic tilt Goal status: Ongoing        PLAN:   PT FREQUENCY: 1-2x/week   PT DURATION: 10 weeks   PLANNED INTERVENTIONS: Therapeutic exercises, Neuromuscular re-education, Gait training, Patient/Family education, Self Care, Joint mobilization, Joint manipulation, Aquatic Therapy, Dry Needling, Electrical stimulation, Spinal manipulation, Spinal mobilization, Cryotherapy, Moist heat, Ultrasound, Manual therapy, and Re-evaluation.   PLAN FOR NEXT SESSION:  Discharge from PT    Maylon Peppers, PT, DPT Physical Therapist - [facility]  [facility]  08/02/2022, 10:26 AM

## 2022-08-04 ENCOUNTER — Encounter: Payer: Self-pay | Admitting: Family Medicine

## 2022-08-04 ENCOUNTER — Ambulatory Visit: Payer: 59 | Admitting: Family Medicine

## 2022-08-04 VITALS — BP 154/80 | HR 104 | Temp 97.6°F | Ht 65.75 in | Wt 354.2 lb

## 2022-08-04 DIAGNOSIS — M5442 Lumbago with sciatica, left side: Secondary | ICD-10-CM | POA: Diagnosis not present

## 2022-08-04 MED ORDER — GABAPENTIN 100 MG PO CAPS: 100.0000 mg | ORAL_CAPSULE | Freq: Every day | ORAL | 0 refills | Status: DC

## 2022-08-04 NOTE — Assessment & Plan Note (Signed)
Chronic, discussed with patient weight management and continued weight loss would be helpful with control of back pain.  She will discuss further with her PCP at next office visit.

## 2022-08-04 NOTE — Assessment & Plan Note (Signed)
Acute on chronic left-sided low back pain with sciatica, ongoing greater than 5 months, not improving, limiting quality of life and functioning.The patient has failed > 6 weeks of conservative treatment including NSAIDs, prednisone, muscle relaxant, physical therapy. Lumbar films showed multilevel mild to moderate degenerative disc disease and facet joint arthritis. We will move forward with MRI to further evaluate and consider possibility of steroid injection. Will likely refer to back specialist for further evaluation and treatment.  Will start trial of gabapentin 100 mg p.o. nightly for neuropathic pain.

## 2022-08-04 NOTE — Progress Notes (Signed)
Patient ID: [name], female    DOB: 1981/01/30, 42 y.o.   MRN: [id number]  This visit was conducted in person.  BP (!) 154/80 (BP Location: Left Arm, Patient Position: Sitting, Cuff Size: Large)   Pulse (!) 104   Temp 97.6 F (36.4 C) (Temporal)   Ht 5' 5.75" (1.67 m)   Wt (!) 354 lb 4 oz (160.7 kg)   LMP 07/21/2022   SpO2 98%   BMI 57.61 kg/m    CC:  Chief Complaint  Patient presents with   Back Pain    Subjective:   HPI: Tracey Cunningham is a 42 y.o. female patient of Dr. Royden Purl with history of hypertension and morbid obesity presenting on 08/04/2022 for Back Pain  Reviewed office visit note when I saw the patient for similar issue on February 18, 2022 and 03/18/2022 At that point patient was diagnosed with acute left-sided low back pain with left-sided sciatica.   treated her with a prednisone taper as well as muscle relaxant as needed. At follow-up, given continued pain x-ray of lumbar spine was completed and she was referred to physical therapy. Lumbar film 03/18/2022:IMPRESSION: Mild to moderate multilevel degenerative disc disease, most prominent at L3-L4, L4-L5, and L5-S1.  Mild lower lumbar predominant facet arthropathy.   She report she was unable to take muscle relaxant given SE. minimal improvement with ibuprofen 800 mg . No improvement with physical therapy. Minimal improvement with prednisone taper  She continues have left-sided low back pain with left-sided sciatica.  She feels that Pt did help with mobility and strength.  Radiation pain in to left leg.  No fever,  no perineal numbness. No weakness , no numbness in left leg.  No  new incontinence.   No back surgeries.   Ain interferes with daily life.. wakes with pain, sometime cannot sleep.  Relevant past medical, surgical, family and social history reviewed and updated as indicated. Interim medical history since our last visit reviewed. Allergies and medications reviewed and  updated. Outpatient Medications Prior to Visit  Medication Sig Dispense Refill   famotidine (PEPCID) 20 MG tablet Take 20 mg by mouth as needed.     cyclobenzaprine (FLEXERIL) 10 MG tablet Take 1 tablet (10 mg total) by mouth at bedtime as needed for muscle spasms. (Patient not taking: Reported on 03/18/2022) 15 tablet 0   predniSONE (DELTASONE) 20 MG tablet 3 tabs by mouth daily x 3 days, then 2 tabs by mouth daily x 2 days then 1 tab by mouth daily x 2 days (Patient not taking: Reported on 05/18/2022) 15 tablet 0   No facility-administered medications prior to visit.     Per HPI unless specifically indicated in ROS section below Review of Systems  Constitutional:  Negative for fatigue and fever.  HENT:  Negative for congestion.   Eyes:  Negative for pain.  Respiratory:  Negative for cough and shortness of breath.   Cardiovascular:  Negative for chest pain, palpitations and leg swelling.  Gastrointestinal:  Negative for abdominal pain.  Genitourinary:  Negative for dysuria and vaginal bleeding.  Musculoskeletal:  Positive for back pain.  Neurological:  Negative for syncope, light-headedness and headaches.  Psychiatric/Behavioral:  Negative for dysphoric mood.    Objective:  BP (!) 154/80 (BP Location: Left Arm, Patient Position: Sitting, Cuff Size: Large)   Pulse (!) 104   Temp 97.6 F (36.4 C) (Temporal)   Ht 5' 5.75" (1.67 m)   Wt (!) 354 lb 4 oz (160.7 kg)  LMP 07/21/2022   SpO2 98%   BMI 57.61 kg/m   Wt Readings from Last 3 Encounters:  08/04/22 (!) 354 lb 4 oz (160.7 kg)  03/18/22 (!) 341 lb 8 oz (154.9 kg)  02/18/22 (!) 331 lb 8 oz (150.4 kg)      Physical Exam Constitutional:      General: She is not in acute distress.    Appearance: Normal appearance. She is well-developed. She is not ill-appearing or toxic-appearing.  HENT:     Head: Normocephalic.     Right Ear: Hearing, tympanic membrane, ear canal and external ear normal. Tympanic membrane is not erythematous,  retracted or bulging.     Left Ear: Hearing, tympanic membrane, ear canal and external ear normal. Tympanic membrane is not erythematous, retracted or bulging.     Nose: No mucosal edema or rhinorrhea.     Right Sinus: No maxillary sinus tenderness or frontal sinus tenderness.     Left Sinus: No maxillary sinus tenderness or frontal sinus tenderness.     Mouth/Throat:     Pharynx: Uvula midline.  Eyes:     General: Lids are normal. Lids are everted, no foreign bodies appreciated.     Conjunctiva/sclera: Conjunctivae normal.     Pupils: Pupils are equal, round, and reactive to light.  Neck:     Thyroid: No thyroid mass or thyromegaly.     Vascular: No carotid bruit.     Trachea: Trachea normal.  Cardiovascular:     Rate and Rhythm: Normal rate and regular rhythm.     Pulses: Normal pulses.     Heart sounds: Normal heart sounds, S1 normal and S2 normal. No murmur heard.    No friction rub. No gallop.  Pulmonary:     Effort: Pulmonary effort is normal. No tachypnea or respiratory distress.     Breath sounds: Normal breath sounds. No decreased breath sounds, wheezing, rhonchi or rales.  Abdominal:     General: Bowel sounds are normal.     Palpations: Abdomen is soft.     Tenderness: There is no abdominal tenderness.  Musculoskeletal:     Cervical back: Normal range of motion and neck supple.     Lumbar back: Tenderness and bony tenderness present. Decreased range of motion. Negative right straight leg raise test and negative left straight leg raise test.  Skin:    General: Skin is warm and dry.     Findings: No rash.  Neurological:     Mental Status: She is alert.  Psychiatric:        Mood and Affect: Mood is not anxious or depressed.        Speech: Speech normal.        Behavior: Behavior normal. Behavior is cooperative.        Thought Content: Thought content normal.        Judgment: Judgment normal.       Results for orders placed or performed in visit on 09/29/20  Hepatic  function panel  Result Value Ref Range   Total Bilirubin 0.5 0.2 - 1.2 mg/dL   Bilirubin, Direct 0.1 0.0 - 0.3 mg/dL   Alkaline Phosphatase 77 39 - 117 U/L   AST 108 (H) 0 - 37 U/L   ALT 167 (H) 0 - 35 U/L   Total Protein 6.9 6.0 - 8.3 g/dL   Albumin 4.1 3.5 - 5.2 g/dL  Basic metabolic panel  Result Value Ref Range   Sodium 138 135 - 145 mEq/L  Potassium 4.5 3.5 - 5.1 mEq/L   Chloride 107 96 - 112 mEq/L   CO2 23 19 - 32 mEq/L   Glucose, Bld 103 (H) 70 - 99 mg/dL   BUN 10 6 - 23 mg/dL   Creatinine, Ser 1.61 0.40 - 1.20 mg/dL   GFR 09.60 >45.40 mL/min   Calcium 8.8 8.4 - 10.5 mg/dL     COVID 19 screen:  No recent travel or known exposure to COVID19 The patient denies respiratory symptoms of COVID 19 at this time. The importance of social distancing was discussed today.   Assessment and Plan Problem List Items Addressed This Visit     Acute left-sided low back pain with left-sided sciatica - Primary    Acute on chronic left-sided low back pain with sciatica, ongoing greater than 5 months, not improving, limiting quality of life and functioning.The patient has failed > 6 weeks of conservative treatment including NSAIDs, prednisone, muscle relaxant, physical therapy. Lumbar films showed multilevel mild to moderate degenerative disc disease and facet joint arthritis. We will move forward with MRI to further evaluate and consider possibility of steroid injection. Will likely refer to back specialist for further evaluation and treatment.  Will start trial of gabapentin 100 mg p.o. nightly for neuropathic pain.      Relevant Medications   gabapentin (NEURONTIN) 100 MG capsule   Other Relevant Orders   MR Lumbar Spine Wo Contrast   Morbid obesity (HCC)    Chronic, discussed with patient weight management and continued weight loss would be helpful with control of back pain.  She will discuss further with her PCP at next office visit.         Kerby Nora, MD

## 2022-08-05 ENCOUNTER — Encounter: Payer: Self-pay | Admitting: *Deleted

## 2022-08-05 DIAGNOSIS — M5442 Lumbago with sciatica, left side: Secondary | ICD-10-CM

## 2022-08-15 ENCOUNTER — Ambulatory Visit: Payer: 59

## 2022-08-19 ENCOUNTER — Telehealth: Payer: Self-pay | Admitting: Family Medicine

## 2022-08-19 NOTE — Telephone Encounter (Signed)
Pt called in requesting a call back stated she receive a letter from her insurance stating she needs to provide additional info  to get her MRI done . Please advise [phone]

## 2022-08-22 ENCOUNTER — Ambulatory Visit: Payer: 59

## 2022-08-22 NOTE — Telephone Encounter (Signed)
See mychart message and referral notes for updates.   Pt is aware that her MRI is under review with insurance.

## 2022-08-25 ENCOUNTER — Ambulatory Visit: Payer: 59

## 2022-08-30 NOTE — Telephone Encounter (Signed)
Referral place... was not sure of the location preference.

## 2022-08-30 NOTE — Telephone Encounter (Signed)
Patient called in and stated that since her MRI was denied, she would like to try to get a referral to a back specialist. Thank you!

## 2022-09-05 ENCOUNTER — Other Ambulatory Visit: Payer: Self-pay | Admitting: Family Medicine

## 2022-09-05 DIAGNOSIS — M5442 Lumbago with sciatica, left side: Secondary | ICD-10-CM

## 2022-09-05 NOTE — Progress Notes (Signed)
Referral placed on 6/18 for PMR referral but looks like it may have bee incorrect ( physical and sports medicine?)... I have placed another referral that should be physical medicine rehab

## 2022-09-05 NOTE — Telephone Encounter (Signed)
See referral message - pt is requesting a different referral specialist ... Ortho not PT/PMR  Thanks!

## 2022-09-09 NOTE — Progress Notes (Signed)
Referral was updated.   See referral for updates

## 2022-10-29 ENCOUNTER — Ambulatory Visit
Admission: RE | Admit: 2022-10-29 | Discharge: 2022-10-29 | Disposition: A | Discharge status: Home / Self Care | Payer: 59 | Source: Ambulatory Visit | Attending: Family Medicine | Admitting: Family Medicine

## 2022-10-29 DIAGNOSIS — M5442 Lumbago with sciatica, left side: Secondary | ICD-10-CM

## 2022-11-19 ENCOUNTER — Ambulatory Visit
Admission: RE | Admit: 2022-11-19 | Discharge: 2022-11-19 | Disposition: A | Discharge status: Home / Self Care | Payer: 59 | Source: Ambulatory Visit | Attending: Family Medicine | Admitting: Family Medicine

## 2023-01-16 ENCOUNTER — Ambulatory Visit: Payer: 59 | Admitting: Dietician

## 2023-02-20 ENCOUNTER — Encounter: Payer: 59 | Attending: Physical Medicine & Rehabilitation | Admitting: Dietician

## 2023-02-20 ENCOUNTER — Encounter: Payer: Self-pay | Admitting: Dietician

## 2023-02-20 DIAGNOSIS — E66813 Obesity, class 3: Secondary | ICD-10-CM | POA: Insufficient documentation

## 2023-02-20 DIAGNOSIS — Z713 Dietary counseling and surveillance: Secondary | ICD-10-CM | POA: Insufficient documentation

## 2023-02-20 DIAGNOSIS — Z6841 Body Mass Index (BMI) 40.0 and over, adult: Secondary | ICD-10-CM | POA: Insufficient documentation

## 2023-02-20 NOTE — Patient Instructions (Addendum)
Consider switching from whole milk to skim milk when you have cereal.  Set an alarm to have breakfast and lunch each day at these times! Breakfast - 8:00 - 9:00 AM Lunch - 12:30 - 1:00 PM  Take an actual lunch break every work day, leave your office and bedroom, make yourself unavailable to work during this period daily!! Make yourself some lunch and read during this time.  Consider trying [employer] crackers to add to your lunch for a high fiber, high protein carb option!  Put 2 cans of beans/soups on your work table as  reminder to do some upper body exercises throughout your work day!!

## 2023-02-20 NOTE — Progress Notes (Signed)
Medical Nutrition Therapy  Appointment Start time:  [phone]  Appointment End time:  1530  Primary concerns today: Weight Loss  Referral diagnosis: E66.01 - Morbid obesity Preferred learning style: No preference indicated Learning readiness: Ready   NUTRITION ASSESSMENT   Clinical Medical Hx: HTN, IBS, GERD, IDA, Obesity Medications: Gabapentin, Pepcid (PRN) Labs: N/A Notable Signs/Symptoms: Back Pain   Lifestyle & Dietary Hx Pt reports desire to lose weight, struggled with weight their whole life, thinks it will improve their back/leg pain. Pt reports having sciatica in L side for the last ~3 years, getting relief from Gabapentin, currently doing aquatic PT 2x a week for 45 minutes,  Pt reports history medical weight loss therapy ~12 years ago, lost 120 lbs and kept it off for 2-3 years, but has since gained it all back. Pt reports this program restricted a lot of foods, did dance videos for exercise and would still enjoy doing them if not for pain. 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 Pt reports missing meals often in the middle of the day, has been drinking Atkins protein shakes for breakfast until recently.  Pt reports occasional GERD, usually in the evening, after having whole milk, chocolate, tomatoes, or soups. Pt reports roommate does most of the shopping and meal prepping for dinner meals.   Estimated daily fluid intake: <64 oz Supplements: N/A Sleep: Sleeps ok, better with gabapentin Stress / self-care: 6-8/10, work related Current average weekly physical activity: ADLs, very limited   24-Hr Dietary Recall First Meal: Banana Snack:  Second Meal:  Snack:  Third Meal: PB crackers Snack:  Beverages: Water, Zero-Sugar lemonade    NUTRITION DIAGNOSIS  [place]-3.3 Overweight/obesity As related to  inactivity/irregular dietary pattern.  As evidenced by BMI of 57.1 kg/m2, significant back pain hindering activity, sedentary job, and skipping 1-2 meals every day.   NUTRITION INTERVENTION  Nutrition education (E-1) on the following topics:  Educated patient on the two components of energy balance: Energy in (calories), and energy out (activity). Explain the role of negative energy balance in weight loss. Discussed options with patient to achieve a negative energy balance and how to best control energy in and energy out to accommodate their lifestyle. Educated patient on work-life balance, and the role of prioritizing themselves over work to reduce stress and encourage change.  Handouts Provided Include  AVS  Learning Style & Readiness for Change Teaching method utilized: Visual & Auditory  Demonstrated degree of understanding via: Teach Back  Barriers to learning/adherence to lifestyle change: Back pain  Goals Established by Pt Consider switching from whole milk to skim milk when you have cereal. Set an alarm to have breakfast and lunch each day at these times! Breakfast - 8:00 - 9:00 AM Lunch - 12:30 - 1:00 PM Take an actual lunch break every work day, leave your office and bedroom, make yourself unavailable to work during this period daily!! Make yourself some lunch and read during this time. Consider trying [employer] crackers to add to your lunch for a high fiber, high protein carb option! Put 2 cans of beans/soups on your work table as  reminder to do some upper body exercises throughout your work day!!   MONITORING & EVALUATION Dietary intake, weekly physical activity, and detaching from work in 2 months.  Next Steps  Patient is to  follow up with RD.

## 2023-03-27 IMAGING — MG MM DIGITAL SCREENING BILAT W/ TOMO AND CAD
8 of 17 series · 8 of 40 positions shown · non-contrast
Comparison: Previous exam(s).

CLINICAL DATA: Screening.

EXAM:
DIGITAL SCREENING BILATERAL MAMMOGRAM WITH TOMOSYNTHESIS AND CAD
TECHNIQUE: Bilateral screening digital craniocaudal and mediolateral oblique
mammograms were obtained. Bilateral screening digital breast
tomosynthesis was performed. The images were evaluated with
computer-aided detection.

[R MLO synth-2D (1 of 2)]
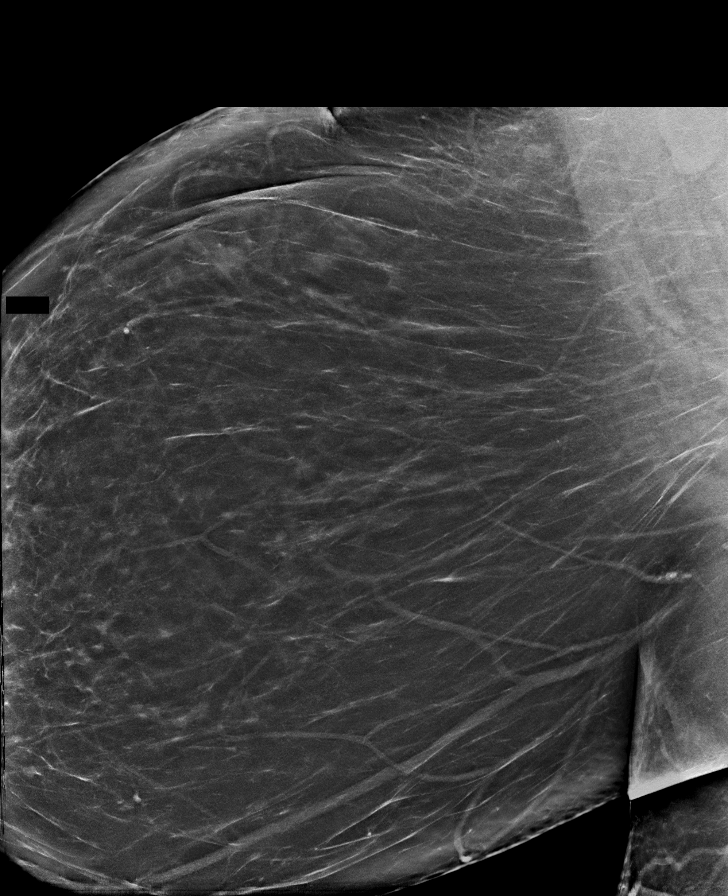

[R MLO synth-2D (2 of 2)]
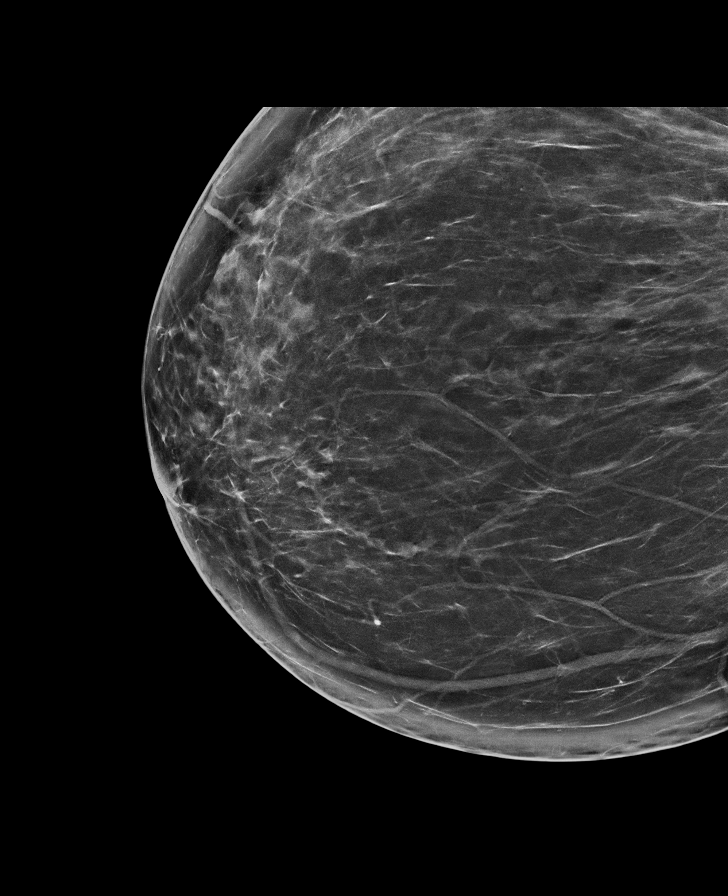

[R CC synth-2D (1 of 2)]
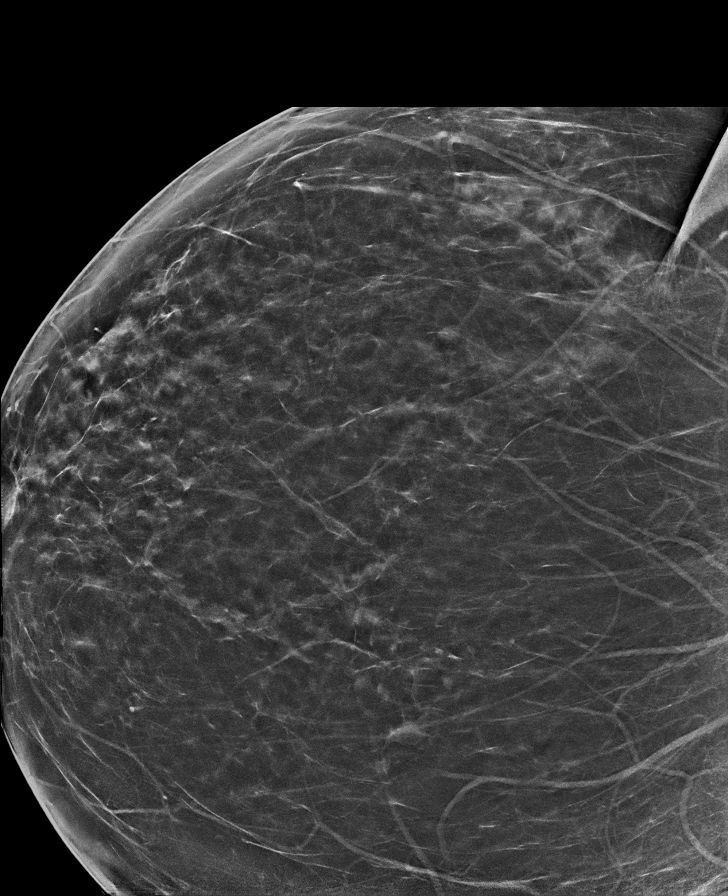

[R CC synth-2D (2 of 2)]
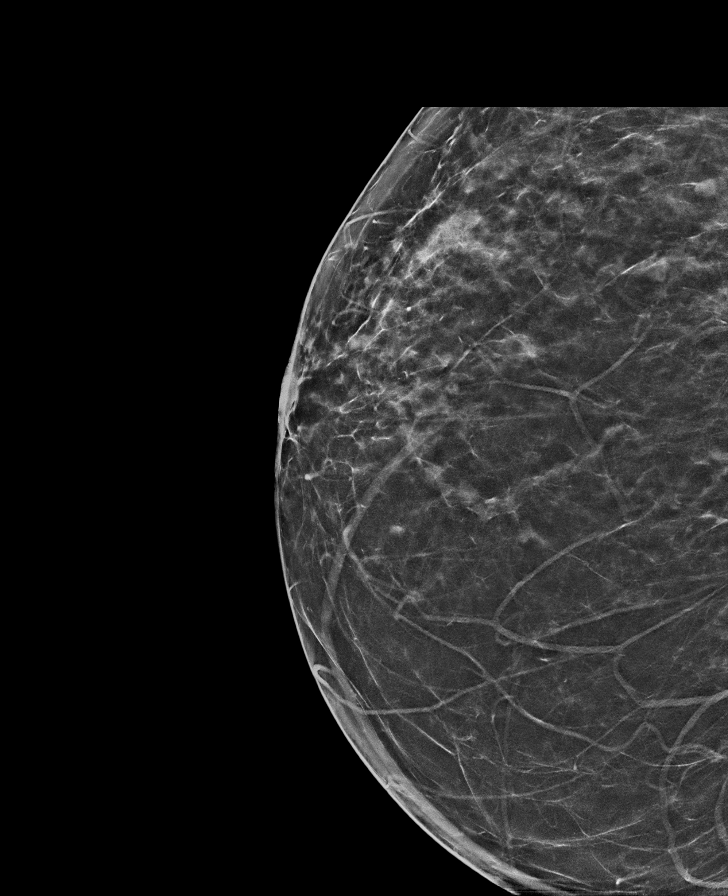

[L MLO synth-2D (1 of 2)]
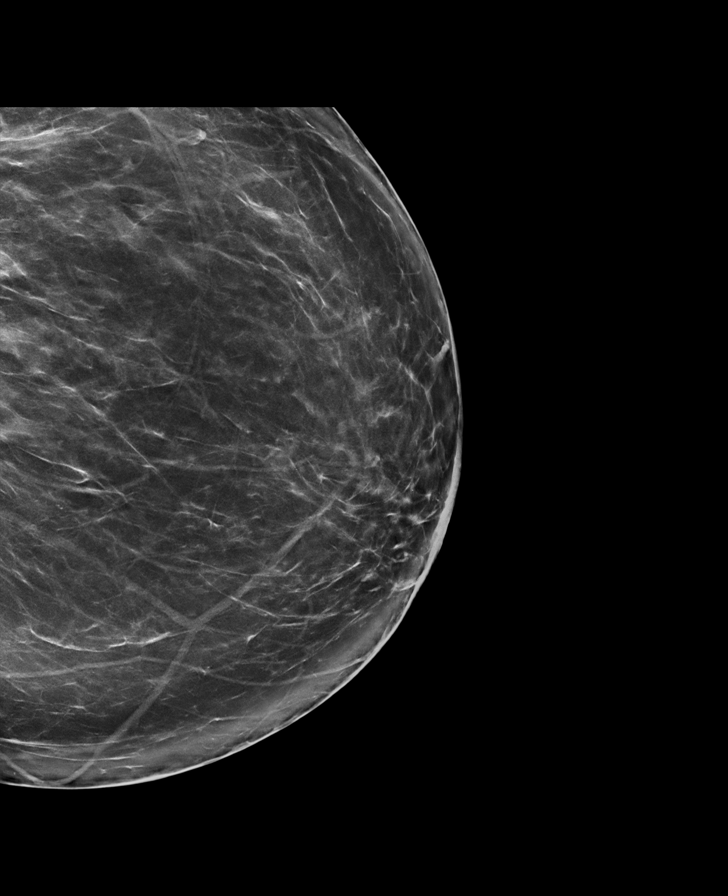

[L CV synth-2D]
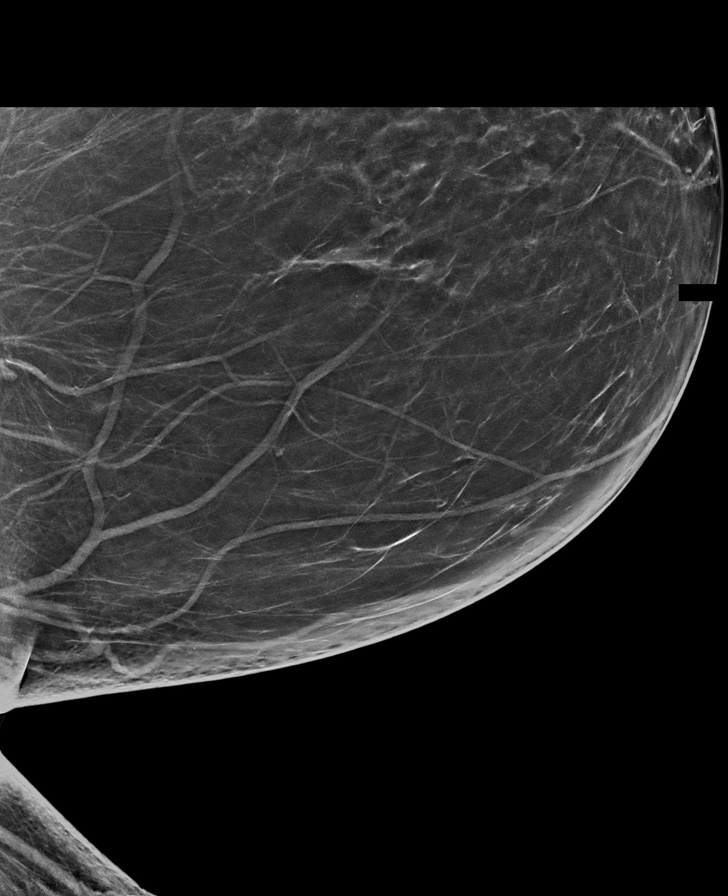

[L MLO synth-2D (2 of 2)]
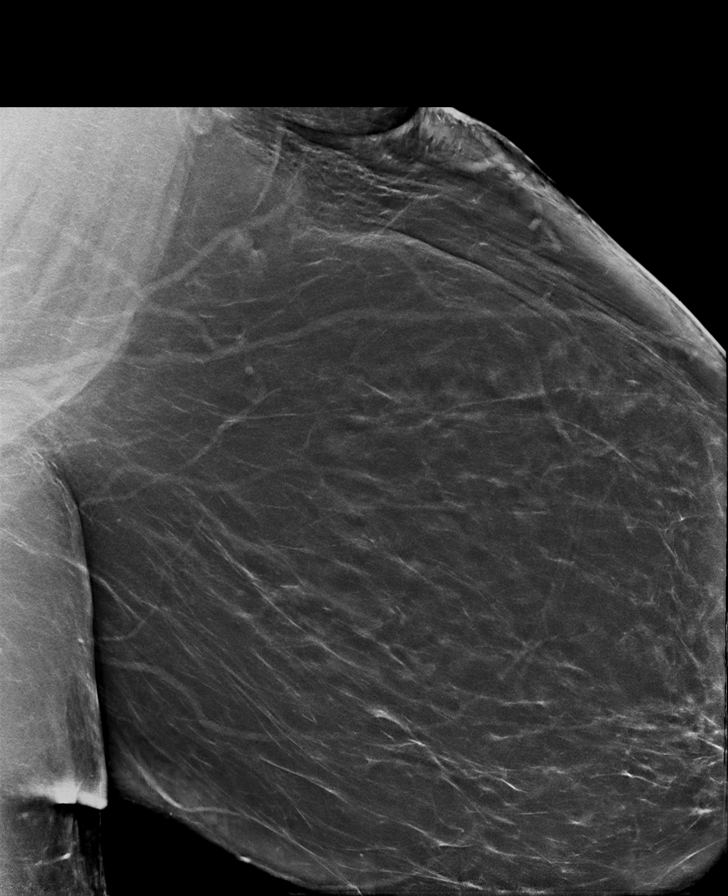

[L CC synth-2D]
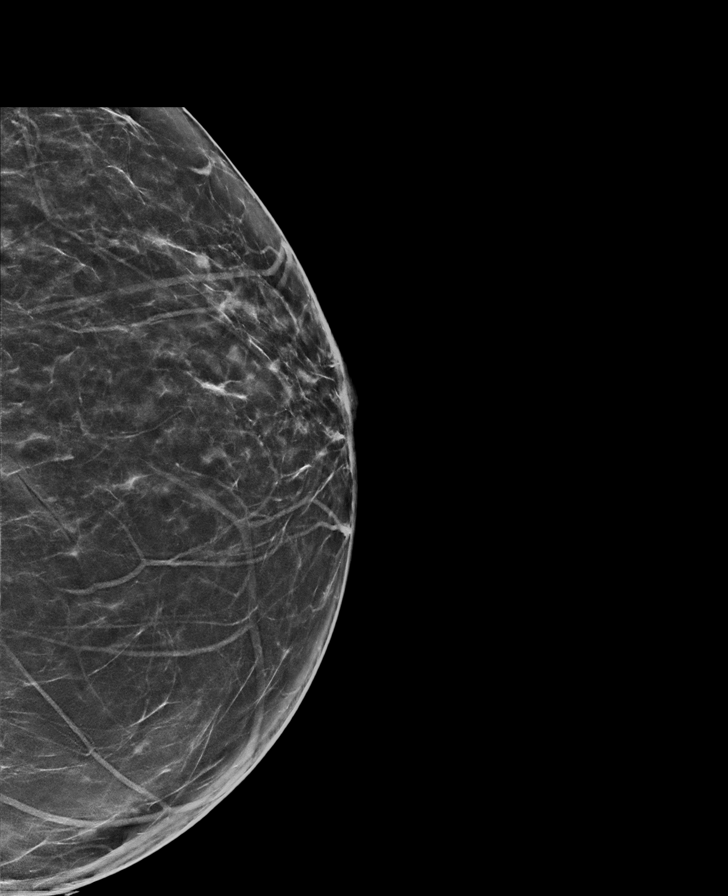

[8 of 40 positions shown; findings below may reference images not displayed]

ACR Breast Density Category b: There are scattered areas of
fibroglandular density.
FINDINGS: There are no findings suspicious for malignancy.
IMPRESSION: No mammographic evidence of malignancy. A result letter of this
screening mammogram will be mailed directly to the patient.

RECOMMENDATION:
Screening mammogram in one year. (Code:[id number]-[id number])

BI-RADS CATEGORY  1: Negative.

## 2023-04-25 ENCOUNTER — Encounter: Payer: BC Managed Care – PPO | Attending: Physical Medicine & Rehabilitation | Admitting: Dietician

## 2023-04-25 NOTE — Patient Instructions (Addendum)
If using Whole milk for your protein coffee, have 8 oz. of milk instead of 16 oz. If having 2% milk, have 16 oz.!  [facility]. Make this appointment for next Friday, the 21st, at 4:00 PM. Communicate this schedule with your sister so that she can join you in getting a membership!  Aim to start your water aerobics classes on Thursday, February 27th at 7:00 PM!  Choose popcorn (Skinny Pop, Boom [place], [place]) or [employer] (Regular size, Rice cake bites) for a snack in place of potato chips.   Try sugar-free Jello pudding with PB2 in it for a sweet replacement.

## 2023-04-25 NOTE — Progress Notes (Unsigned)
Medical Nutrition Therapy  Appointment Start time:  [phone]  Appointment End time:  1435  Primary concerns today: Weight Loss  Referral diagnosis: E66.01 - Morbid obesity Preferred learning style: No preference indicated Learning readiness: Ready   NUTRITION ASSESSMENT   Anthropometrics Ht: 65" Wt: 369.0 lbs BMI: 61.40 kg/m2  Clinical Medical Hx: HTN, IBS, GERD, IDA, Obesity Medications: Gabapentin, Pepcid (PRN) Labs: N/A Notable Signs/Symptoms: Back Pain   Lifestyle & Dietary Hx Pt reports they are doing better with taking a break to eat lunch daily, setting an alarm to get up a couple of times a day.  Pt reports having a protein coffee (2% Fairlife milk, decaf coffee) to start their days, eating regular grits or oatmeal for lunch. Pt reports eating more beans now, trying to have meatless meals 2-3 times a week. Pt reports trying to drink 2-3 quart jars of water each day.  Pt reports improvement in back pain, finished water therapy at the end of 2024, states they are planning to join the [employer] in [employer] for [employer] (T/Th 7:00-8:00 PM), trying to get sister to join for accountability.  Pt reports erratic work schedule in January, trying to reset sleep schedule, states they were stress eating (chips, sweets, chocolate) fairly often during this time, amount depends on how much is available.  Pt reports doing chair yoga, low impact stretching, and meditation from [employer] (work fitness challenge) couple times a week for 15-20 minutes. Pt reports their work does a different fitness challenge monthly.  Pt reports they will be restarting school in March, usually does class work in the evenings (9-12:00 PM) and occasionally the weekends.    Estimated daily fluid intake: <64 oz Supplements: N/A Sleep: Sleeps ok, better with gabapentin Stress / self-care: 6-8/10, work related Current average weekly physical activity: ADLs, very limited   24-Hr Dietary Recall First Meal:  Protein coffee Snack:  Second Meal: Grits Snack:  Third Meal: None-fell asleep Snack:  Beverages: Coffee, 32 oz    NUTRITION DIAGNOSIS  [place]-3.3 Overweight/obesity As related to inactivity/irregular dietary pattern.  As evidenced by BMI of 61.40 kg/m2, significant back pain hindering activity, sedentary job, and skipping 1-2 meals every day.   NUTRITION INTERVENTION  Nutrition education (E-1) on the following topics:  Educated patient on the two components of energy balance: Energy in (calories), and energy out (activity). Explain the role of negative energy balance in weight loss. Discussed options with patient to achieve a negative energy balance and how to best control energy in and energy out to accommodate their lifestyle. Educated patient on work-life balance, and the role of prioritizing themselves over work to reduce stress and encourage change.  Handouts Provided Include  AVS  Learning Style & Readiness for Change Teaching method utilized: Visual & Auditory  Demonstrated degree of understanding via: Teach Back  Barriers to learning/adherence to lifestyle change: Back pain  Goals Established by Pt Consider switching from whole milk to skim milk when you have cereal. Set an alarm to have breakfast and lunch each day at these times! Breakfast - 8:00 - 9:00 AM Lunch - 12:30 - 1:00 PM Take an actual lunch break every work day, leave your office and bedroom, make yourself unavailable to work during this period daily!! Make yourself some lunch and read during this time. Consider trying [employer] crackers to add to your lunch for a high fiber, high protein carb option! Put 2 cans of beans/soups on your work table as reminder to do some upper  body exercises throughout your work day!!   MONITORING & EVALUATION Dietary intake, weekly physical activity, and detaching from work in 2 months.  Next Steps  Patient is to follow up with RD.

## 2023-05-17 ENCOUNTER — Other Ambulatory Visit: Payer: Self-pay | Admitting: Family Medicine

## 2023-05-17 DIAGNOSIS — Z1231 Encounter for screening mammogram for malignant neoplasm of breast: Secondary | ICD-10-CM

## 2023-05-26 ENCOUNTER — Ambulatory Visit
Admission: RE | Admit: 2023-05-26 | Discharge: 2023-05-26 | Disposition: A | Discharge status: Home / Self Care | Source: Ambulatory Visit | Attending: Family Medicine | Admitting: Family Medicine

## 2023-05-26 DIAGNOSIS — Z1231 Encounter for screening mammogram for malignant neoplasm of breast: Secondary | ICD-10-CM | POA: Insufficient documentation

## 2023-05-30 ENCOUNTER — Encounter: Payer: Self-pay | Admitting: Family Medicine

## 2023-07-06 ENCOUNTER — Ambulatory Visit: Payer: BC Managed Care – PPO | Admitting: Dietician

## 2023-08-21 DIAGNOSIS — Z713 Dietary counseling and surveillance: Secondary | ICD-10-CM | POA: Diagnosis not present

## 2023-08-24 ENCOUNTER — Ambulatory Visit: Admitting: Dietician

## 2023-08-30 DIAGNOSIS — Z713 Dietary counseling and surveillance: Secondary | ICD-10-CM | POA: Diagnosis not present

## 2023-09-06 DIAGNOSIS — Z713 Dietary counseling and surveillance: Secondary | ICD-10-CM | POA: Diagnosis not present

## 2023-09-13 DIAGNOSIS — Z713 Dietary counseling and surveillance: Secondary | ICD-10-CM | POA: Diagnosis not present

## 2023-09-27 DIAGNOSIS — G8929 Other chronic pain: Secondary | ICD-10-CM | POA: Diagnosis not present

## 2023-09-27 DIAGNOSIS — M5442 Lumbago with sciatica, left side: Secondary | ICD-10-CM | POA: Diagnosis not present

## 2023-09-27 DIAGNOSIS — M5416 Radiculopathy, lumbar region: Secondary | ICD-10-CM | POA: Diagnosis not present

## 2023-09-29 DIAGNOSIS — Z713 Dietary counseling and surveillance: Secondary | ICD-10-CM | POA: Diagnosis not present

## 2023-10-06 DIAGNOSIS — Z713 Dietary counseling and surveillance: Secondary | ICD-10-CM | POA: Diagnosis not present

## 2023-10-13 DIAGNOSIS — Z713 Dietary counseling and surveillance: Secondary | ICD-10-CM | POA: Diagnosis not present

## 2023-10-26 ENCOUNTER — Ambulatory Visit: Payer: Self-pay | Admitting: Family Medicine

## 2023-10-26 ENCOUNTER — Encounter: Payer: Self-pay | Admitting: Family Medicine

## 2023-10-26 ENCOUNTER — Ambulatory Visit (INDEPENDENT_AMBULATORY_CARE_PROVIDER_SITE_OTHER): Admitting: Family Medicine

## 2023-10-26 VITALS — BP 156/88 | HR 100 | Temp 98.6°F | Ht 65.5 in | Wt 378.0 lb

## 2023-10-26 DIAGNOSIS — D500 Iron deficiency anemia secondary to blood loss (chronic): Secondary | ICD-10-CM | POA: Diagnosis not present

## 2023-10-26 DIAGNOSIS — N649 Disorder of breast, unspecified: Secondary | ICD-10-CM | POA: Insufficient documentation

## 2023-10-26 DIAGNOSIS — Z114 Encounter for screening for human immunodeficiency virus [HIV]: Secondary | ICD-10-CM | POA: Diagnosis not present

## 2023-10-26 DIAGNOSIS — M5442 Lumbago with sciatica, left side: Secondary | ICD-10-CM

## 2023-10-26 DIAGNOSIS — R7401 Elevation of levels of liver transaminase levels: Secondary | ICD-10-CM | POA: Diagnosis not present

## 2023-10-26 DIAGNOSIS — H6123 Impacted cerumen, bilateral: Secondary | ICD-10-CM

## 2023-10-26 DIAGNOSIS — Z0000 Encounter for general adult medical examination without abnormal findings: Secondary | ICD-10-CM | POA: Diagnosis not present

## 2023-10-26 DIAGNOSIS — N946 Dysmenorrhea, unspecified: Secondary | ICD-10-CM

## 2023-10-26 DIAGNOSIS — I10 Essential (primary) hypertension: Secondary | ICD-10-CM | POA: Diagnosis not present

## 2023-10-26 DIAGNOSIS — R7303 Prediabetes: Secondary | ICD-10-CM | POA: Insufficient documentation

## 2023-10-26 DIAGNOSIS — Z131 Encounter for screening for diabetes mellitus: Secondary | ICD-10-CM | POA: Insufficient documentation

## 2023-10-26 DIAGNOSIS — Z1159 Encounter for screening for other viral diseases: Secondary | ICD-10-CM | POA: Insufficient documentation

## 2023-10-26 LAB — COMPREHENSIVE METABOLIC PANEL WITH GFR
ALT: 124 U/L — ABNORMAL HIGH (ref 0–35)
AST: 65 U/L — ABNORMAL HIGH (ref 0–37)
Albumin: 4.1 g/dL (ref 3.5–5.2)
Alkaline Phosphatase: 95 U/L (ref 39–117)
BUN: 10 mg/dL (ref 6–23)
CO2: 26 meq/L (ref 19–32)
Calcium: 8.8 mg/dL (ref 8.4–10.5)
Chloride: 104 meq/L (ref 96–112)
Creatinine, Ser: 0.71 mg/dL (ref 0.40–1.20)
GFR: 104.39 mL/min (ref >60.00)
Glucose, Bld: 114 mg/dL — ABNORMAL HIGH (ref 70–99)
Potassium: 4.4 meq/L (ref 3.5–5.1)
Sodium: 139 meq/L (ref 135–145)
Total Bilirubin: 0.6 mg/dL (ref 0.2–1.2)
Total Protein: 6.7 g/dL (ref 6.0–8.3)

## 2023-10-26 LAB — IRON: Iron: 46 ug/dL (ref 42–145)

## 2023-10-26 LAB — FERRITIN: Ferritin: 57.7 ng/mL (ref 10.0–291.0)

## 2023-10-26 LAB — CBC WITH DIFFERENTIAL/PLATELET
Basophils Absolute: 0 K/uL (ref 0.0–0.1)
Basophils Relative: 0.5 % (ref 0.0–3.0)
Eosinophils Absolute: 0.2 K/uL (ref 0.0–0.7)
Eosinophils Relative: 2.1 % (ref 0.0–5.0)
HCT: 37.5 % (ref 36.0–46.0)
Hemoglobin: 11.8 g/dL — ABNORMAL LOW (ref 12.0–15.0)
Lymphocytes Relative: 25.4 % (ref 12.0–46.0)
Lymphs Abs: 1.9 K/uL (ref 0.7–4.0)
MCHC: 31.5 g/dL (ref 30.0–36.0)
MCV: 75.9 fl — ABNORMAL LOW (ref 78.0–100.0)
Monocytes Absolute: 0.4 K/uL (ref 0.1–1.0)
Monocytes Relative: 5.7 % (ref 3.0–12.0)
Neutro Abs: 4.9 K/uL (ref 1.4–7.7)
Neutrophils Relative %: 66.3 % (ref 43.0–77.0)
Platelets: 261 K/uL (ref 150.0–400.0)
RBC: 4.94 Mil/uL (ref 3.87–5.11)
RDW: 17.5 % — ABNORMAL HIGH (ref 11.5–15.5)
WBC: 7.4 K/uL (ref 4.0–10.5)

## 2023-10-26 LAB — LIPID PANEL
Cholesterol: 162 mg/dL (ref 0–200)
HDL: 43 mg/dL (ref >39.00)
LDL Cholesterol: 94 mg/dL (ref 0–99)
NonHDL: 119.05
Total CHOL/HDL Ratio: 4
Triglycerides: 125 mg/dL (ref 0.0–149.0)
VLDL: 25 mg/dL (ref 0.0–40.0)

## 2023-10-26 LAB — HEMOGLOBIN A1C: Hgb A1c MFr Bld: 6.4 % (ref 4.6–6.5)

## 2023-10-26 LAB — TSH: TSH: 2.56 u[IU]/mL (ref 0.35–5.50)

## 2023-10-26 MED ORDER — HYDROCHLOROTHIAZIDE 25 MG PO TABS: 25.0000 mg | ORAL_TABLET | Freq: Every day | ORAL | 0 refills | Status: DC

## 2023-10-26 NOTE — Assessment & Plan Note (Signed)
 Reviewed health habits including diet and exercise and skin cancer prevention Reviewed appropriate screening tests for age  Also reviewed health mt list, fam hx and immunization status , as well as social and family history   See HPI Labs reviewed and ordered Health Maintenance  Topic Date Due   HIV Screening  Never done   Hepatitis C Screening  Never done   HPV Vaccine (1 - 3-dose SCDM series) Never done   Pap with HPV screening  04/21/2019   Flu Shot  06/11/2024*   COVID-19 Vaccine (1 - 2024-25 season) 11/10/2024*   Mammogram  05/25/2024   DTaP/Tdap/Td vaccine (3 - Td or Tdap) 12/23/2028   Hepatitis B Vaccine  Completed   Meningitis B Vaccine  Aged Out  *Topic was postponed. The date shown is not the original due date.    Mammogram utd -pt declines genetic testing (in setting of fam history of breast cancer) but may consider in future  Planning gyn follow up / due for exam and pap  Discussed fall prevention, supplements and exercise for bone density   Seeing nutritionist for help to loose weight  PHQ 0 Lab today

## 2023-10-26 NOTE — Assessment & Plan Note (Signed)
 Blood pressure trending up  Pt is working with a nutritionist and doing some exercise   Will start back on hydrochlorothiazide  25 mg daily which she has taken before  Lab today  Follow up 2 wk for visit and lab   Encouraged low sodium diet/ exercise and weight loss

## 2023-10-26 NOTE — Assessment & Plan Note (Signed)
 Discussed how this problem influences overall health and the risks it imposes  Reviewed plan for weight loss with lower calorie diet (via better food choices (lower glycemic and portion control) along with exercise building up to or more than 30 minutes 5 days per week including some aerobic activity and strength training    Seeing a nutritionist  Started pedaling for exercise  A1c added to labs

## 2023-10-26 NOTE — Assessment & Plan Note (Signed)
 Not taking iron  Menses are heavy   Cbc and iron today

## 2023-10-26 NOTE — Assessment & Plan Note (Signed)
 No OC due to elevated blood pressure Due for follow up with gyn Irregular menses but does not skip  Cbc and iron levels today

## 2023-10-26 NOTE — Assessment & Plan Note (Signed)
 Hep C today  Low risk

## 2023-10-26 NOTE — Progress Notes (Signed)
 Subjective:    Patient ID: Tracey Cunningham, female    DOB: 1981/01/14, 43 y.o.   MRN: [id number]  HPI  Here for health maintenance exam and to review chronic medical problems   Wt Readings from Last 3 Encounters:  10/26/23 (!) 378 lb (171.5 kg)  04/25/23 (!) 369 lb (167.4 kg)  08/04/22 (!) 354 lb 4 oz (160.7 kg)   61.95 kg/m  Vitals:   10/26/23 0826 10/26/23 0859  BP: (!) 154/98 (!) 156/88  Pulse: 100   Temp: 98.6 F (37 C)   SpO2: 95%     Immunization History  Administered Date(s) Administered   Hep A / Hep B 04/05/2019, 05/10/2019, 09/06/2019   Influenza Split 01/31/2012   Influenza,inj,Quad PF,6+ Mos 02/13/2013, 02/02/2016, 12/24/2018   Td 11/20/2007   Tdap 12/24/2018    Health Maintenance Due  Topic Date Due   HIV Screening  Never done   Hepatitis C Screening  Never done   HPV VACCINES (1 - 3-dose SCDM series) Never done   Cervical Cancer Screening (HPV/Pap Cotest)  04/21/2019   In school for accounting / 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 October  Also working    Interested in hep C/ HIV screen   Mammogram 05/2023  Strong family history of breast cancer  Self breast exam- spot came up on skin in early July on right side   No pain or itching  No lumps     Gyn health Pap 04/2016 Declines today , plans to follow up with her gyn Menses  Heavy in general  /tolerable  Cannot take combined OC due to blood pressure     Colon cancer screening - no fam history   Bone health   Falls-none  Fractures-none  Supplements -none    Exercise - just recently started pedaling  15 to 20 min per day  Thinking about a recumbent bike for home  Stretches for PT    Has not been to derm for a while  Some sun exp as a child    Mood    10/26/2023    8:32 AM 02/20/2023    2:13 PM 03/18/2022    2:42 PM 12/24/2018    4:25 PM 01/31/2012    8:33 AM  Depression screen PHQ 2/9  Decreased Interest 0 0 0 0 0  Down, Depressed, Hopeless 0 0 0 0 0  PHQ - 2 Score 0 0 0 0 0    Altered sleeping 0   1   Tired, decreased energy 0   1   Change in appetite 0   0   Feeling bad or failure about yourself  0   0   Trouble concentrating 0   0   Moving slowly or fidgety/restless 0   0   Suicidal thoughts 0   0   PHQ-9 Score 0   2   Difficult doing work/chores Not difficult at all   Not difficult at all      Data saved with a previous flowsheet row definition    HTN bp is stable today  No cp or palpitations or headaches or edema  No side effects to medicines  BP Readings from Last 3 Encounters:  10/26/23 (!) 156/88  08/04/22 (!) 154/80  03/18/22 (!) 148/78    Blood pressure has been trending high  Some high blood pressure in family   Did tolerate hydrochlorothiazide  25 mg in past    Lab Results  Component Value Date   NA 139 10/26/2023  K 4.4 10/26/2023   CO2 26 10/26/2023   GLUCOSE 114 (H) 10/26/2023   BUN 10 10/26/2023   CREATININE 0.71 10/26/2023   CALCIUM 8.8 10/26/2023   GFR 104.39 10/26/2023   GFRNONAA 93 08/28/2006   No medications currently  Hydrochlorothiazide  in past   Lab Results  Component Value Date   ALT 124 (H) 10/26/2023   AST 65 (H) 10/26/2023   ALKPHOS 95 10/26/2023   BILITOT 0.6 10/26/2023   History of elevated liver tests   No etoh  Ibuprofen prn /no tylenol   Gabapentin  tid for back pain  Helping  Better than a year ago       Patient Active Problem List   Diagnosis Date Noted   Encounter for hepatitis C screening test for low risk patient 10/26/2023   Encounter for screening for HIV 10/26/2023   Diabetes mellitus screening 10/26/2023   Lesion of breast 10/26/2023   Acute left-sided low back pain with left-sided sciatica 02/18/2022   Liver lesion 09/28/2020   Pulmonary nodule 08/23/2020   Lower abdominal pain 05/08/2020   Left groin pain 04/10/2020   Elevated liver transaminase level 01/01/2019   Lump on finger, right 10/10/2018   Family history of breast cancer in mother 02/02/2016   GERD  (gastroesophageal reflux disease) 11/05/2014   Encounter for routine gynecological examination 01/31/2012   Routine general medical examination at a health care facility 01/31/2012   Cerumen impaction 05/14/2009   IRRITABLE BOWEL SYNDROME 04/23/2008   Morbid obesity (HCC) 04/19/2007   ANEMIA-IRON DEFICIENCY 08/24/2006   Essential hypertension 08/24/2006   MENORRHALGIA 08/24/2006   Past Medical History:  Diagnosis Date   Anemia    iron deficiency    Cerumen impaction    Family history of breast cancer    5/21 cancer genetic testing letter sent   Hypertension    borderline   Irregular periods    OB[facility]N- [facility]    Obesity    weight loss with diet and exercise    Past Surgical History:  Procedure Laterality Date   APPENDECTOMY  10/2005   CHOLECYSTECTOMY     foot fracture     right foot    Social History   Tobacco Use   Smoking status: Never   Smokeless tobacco: Never  Vaping Use   Vaping status: Never Used  Substance Use Topics   Alcohol use: Yes    Alcohol/week: 0.0 standard drinks of alcohol    Comment: wine- occ   Drug use: No   Family History  Problem Relation Age of Onset   Hypertension Maternal Grandfather    Heart attack Maternal Grandfather    Breast cancer Mother 79       triple neg   Breast cancer Maternal Grandmother        70's   Breast cancer Paternal Grandmother        17's   Alcohol abuse Father    No Known Allergies Current Outpatient Medications on File Prior to Visit  Medication Sig Dispense Refill   famotidine (PEPCID) 20 MG tablet Take 20 mg by mouth as needed.     gabapentin  (NEURONTIN ) 300 MG capsule Take 2 capsules by mouth 3 (three) times daily.     No current facility-administered medications on file prior to visit.    Review of Systems  Constitutional:  Positive for fatigue. Negative for activity change, appetite change, fever and unexpected weight change.  HENT:  Negative for congestion, ear pain, rhinorrhea, sinus pressure  and sore throat.  Eyes:  Negative for pain, redness and visual disturbance.  Respiratory:  Negative for cough, shortness of breath and wheezing.   Cardiovascular:  Negative for chest pain and palpitations.  Gastrointestinal:  Negative for abdominal pain, blood in stool, constipation and diarrhea.  Endocrine: Negative for polydipsia and polyuria.  Genitourinary:  Negative for dysuria, frequency and urgency.  Musculoskeletal:  Negative for arthralgias, back pain and myalgias.  Skin:  Negative for pallor and rash.  Allergic/Immunologic: Negative for environmental allergies.  Neurological:  Negative for dizziness, syncope and headaches.  Hematological:  Negative for adenopathy. Does not bruise/bleed easily.  Psychiatric/Behavioral:  Negative for decreased concentration and dysphoric mood. The patient is not nervous/anxious.        Objective:   Physical Exam Constitutional:      General: She is not in acute distress.    Appearance: Normal appearance. She is well-developed. She is obese. She is not ill-appearing or diaphoretic.  HENT:     Head: Normocephalic and atraumatic.     Right Ear: Tympanic membrane, ear canal and external ear normal.     Left Ear: Tympanic membrane, ear canal and external ear normal.     Nose: Nose normal. No congestion.     Mouth/Throat:     Mouth: Mucous membranes are moist.     Pharynx: Oropharynx is clear. No posterior oropharyngeal erythema.  Eyes:     General: No scleral icterus.    Extraocular Movements: Extraocular movements intact.     Conjunctiva/sclera: Conjunctivae normal.     Pupils: Pupils are equal, round, and reactive to light.  Neck:     Thyroid: No thyromegaly.     Vascular: No carotid bruit or JVD.  Cardiovascular:     Rate and Rhythm: Normal rate and regular rhythm.     Pulses: Normal pulses.     Heart sounds: Normal heart sounds.     No gallop.  Pulmonary:     Effort: Pulmonary effort is normal. No respiratory distress.     Breath  sounds: Normal breath sounds. No wheezing.     Comments: Good air exch Chest:     Chest wall: No tenderness.  Abdominal:     General: Bowel sounds are normal. There is no distension or abdominal bruit.     Palpations: Abdomen is soft. There is no mass.     Tenderness: There is no abdominal tenderness.     Hernia: No hernia is present.  Genitourinary:    Comments: Breast exam: No mass, nodules, thickening, tenderness, bulging, retraction, inflamation, nipple discharge or skin changes noted.  No axillary or clavicular LA.      2 cm blue raised compressible superficial lump noted on underside of right breast consistent with varicosity or hematoma  Non tender  Mobile    Musculoskeletal:        General: No tenderness. Normal range of motion.     Cervical back: Normal range of motion and neck supple. No rigidity. No muscular tenderness.     Right lower leg: No edema.     Left lower leg: No edema.     Comments: No kyphosis   Lymphadenopathy:     Cervical: No cervical adenopathy.  Skin:    General: Skin is warm and dry.     Coloration: Skin is not pale.     Findings: No erythema or rash.     Comments: Solar lentigines diffusely   Neurological:     Mental Status: She is alert. Mental status is at baseline.  Cranial Nerves: No cranial nerve deficit.     Motor: No abnormal muscle tone.     Coordination: Coordination normal.     Gait: Gait normal.     Deep Tendon Reflexes: Reflexes are normal and symmetric. Reflexes normal.  Psychiatric:        Mood and Affect: Mood normal.        Cognition and Memory: Cognition and memory normal.           Assessment & Plan:   Problem List Items Addressed This Visit       Cardiovascular and Mediastinum   Essential hypertension   Blood pressure trending up  Pt is working with a nutritionist and doing some exercise   Will start back on hydrochlorothiazide  25 mg daily which she has taken before  Lab today  Follow up 2 wk for visit and  lab   Encouraged low sodium diet/ exercise and weight loss       Relevant Medications   hydrochlorothiazide  (HYDRODIURIL ) 25 MG tablet   Other Relevant Orders   Comprehensive metabolic panel with GFR (Completed)   CBC with Differential/Platelet (Completed)   TSH (Completed)   Lipid Panel (Completed)     Nervous and Auditory   Cerumen impaction   Right side  Encouraged use of debrox If not improved can irrigate in future       Acute left-sided low back pain with left-sided sciatica   Chronic back pain is improved with gabapentin  300 mg tid       Relevant Medications   gabapentin  (NEURONTIN ) 300 MG capsule     Genitourinary   MENORRHALGIA   No OC due to elevated blood pressure Due for follow up with gyn Irregular menses but does not skip  Cbc and iron levels today        Relevant Orders   CBC with Differential/Platelet (Completed)   Iron (Completed)   Ferritin (Completed)     Other   Routine general medical examination at a health care facility - Primary   Reviewed health habits including diet and exercise and skin cancer prevention Reviewed appropriate screening tests for age  Also reviewed health mt list, fam hx and immunization status , as well as social and family history   See HPI Labs reviewed and ordered Health Maintenance  Topic Date Due   HIV Screening  Never done   Hepatitis C Screening  Never done   HPV Vaccine (1 - 3-dose SCDM series) Never done   Pap with HPV screening  04/21/2019   Flu Shot  06/11/2024*   COVID-19 Vaccine (1 - 2024-25 season) 11/10/2024*   Mammogram  05/25/2024   DTaP/Tdap/Td vaccine (3 - Td or Tdap) 12/23/2028   Hepatitis B Vaccine  Completed   Meningitis B Vaccine  Aged Out  *Topic was postponed. The date shown is not the original due date.    Mammogram utd -pt declines genetic testing (in setting of fam history of breast cancer) but may consider in future  Planning gyn follow up / due for exam and pap  Discussed fall  prevention, supplements and exercise for bone density   Seeing nutritionist for help to loose weight  PHQ 0 Lab today      Morbid obesity (HCC)   Discussed how this problem influences overall health and the risks it imposes  Reviewed plan for weight loss with lower calorie diet (via better food choices (lower glycemic and portion control) along with exercise building up to or more than 30  minutes 5 days per week including some aerobic activity and strength training    Seeing a nutritionist  Started pedaling for exercise  A1c added to labs       Relevant Orders   Hemoglobin A1c (Completed)   Lesion of breast   Right inferior lateral breast - 2 cm area of blue discoloration that resembles a varicosity  Non tender  Compressible Pt does not remember trauma to the area  Will observe closely  Follow up planned 2 wk for blood pressure and will re check   Mammogram utd        Encounter for screening for HIV   HIV screen today      Relevant Orders   HIV Antibody (routine testing w rflx)   Encounter for hepatitis C screening test for low risk patient   Hep C today  Low risk       Relevant Orders   Hepatitis C Antibody   Elevated liver transaminase level   Lab today  No tylenol or etoh       Diabetes mellitus screening   In setting of morbid obesity   A1c added to labs      Relevant Orders   Hemoglobin A1c (Completed)   ANEMIA-IRON DEFICIENCY   Not taking iron  Menses are heavy   Cbc and iron today      Relevant Orders   CBC with Differential/Platelet (Completed)   Iron (Completed)   Ferritin (Completed)

## 2023-10-26 NOTE — Assessment & Plan Note (Signed)
-

## 2023-10-26 NOTE — Assessment & Plan Note (Signed)
 In setting of morbid obesity   A1c added to labs

## 2023-10-26 NOTE — Assessment & Plan Note (Signed)
 Right side  Encouraged use of debrox If not improved can irrigate in future

## 2023-10-26 NOTE — Assessment & Plan Note (Signed)
 Chronic back pain is improved with gabapentin  300 mg tid

## 2023-10-26 NOTE — Patient Instructions (Addendum)
 For bone health Get vitamin D3 over the counter and take 2000 international units daily   Stay active  Add strength training  Add some strength training to your routine, this is important for bone and brain health and can reduce your risk of falls and help your body use insulin properly and regulate weight  Light weights, exercise bands , and internet videos are a good way to start  Yoga (chair or regular), machines , floor exercises or a gym with machines are also good options    See your gyn  Have them also look at bruised spot on breast   Continue the nutritionist visits  Let them know you have high blood pressure as well   Labs today   Start back on the hydrochlorothiazide  25 mg daily in the am  If any side effects let us  know   Follow up in about 2 weeks for blood pressure check and likely labs for blood pressure

## 2023-10-26 NOTE — Assessment & Plan Note (Addendum)
 Right inferior lateral breast - 2 cm area of blue discoloration that resembles a varicosity or hematoma  Non tender  Compressible Pt does not remember trauma to the area  Will observe closely  Follow up planned 2 wk for blood pressure and will re check then   Mammogram utd

## 2023-10-26 NOTE — Assessment & Plan Note (Signed)
 Lab today  No tylenol or etoh

## 2023-10-27 LAB — HEPATITIS C ANTIBODY: Hepatitis C Ab: NONREACTIVE

## 2023-10-27 LAB — HIV ANTIBODY (ROUTINE TESTING W REFLEX): HIV 1&2 Ab, 4th Generation: NONREACTIVE

## 2023-11-03 DIAGNOSIS — Z713 Dietary counseling and surveillance: Secondary | ICD-10-CM | POA: Diagnosis not present

## 2023-11-09 ENCOUNTER — Ambulatory Visit: Admitting: Family Medicine

## 2023-11-09 ENCOUNTER — Encounter: Payer: Self-pay | Admitting: Family Medicine

## 2023-11-09 ENCOUNTER — Ambulatory Visit: Payer: Self-pay | Admitting: Family Medicine

## 2023-11-09 ENCOUNTER — Other Ambulatory Visit: Payer: Self-pay | Admitting: Family Medicine

## 2023-11-09 VITALS — BP 144/80 | HR 99 | Temp 98.1°F | Ht 65.5 in | Wt 371.2 lb

## 2023-11-09 DIAGNOSIS — I10 Essential (primary) hypertension: Secondary | ICD-10-CM

## 2023-11-09 DIAGNOSIS — N649 Disorder of breast, unspecified: Secondary | ICD-10-CM

## 2023-11-09 DIAGNOSIS — E876 Hypokalemia: Secondary | ICD-10-CM | POA: Insufficient documentation

## 2023-11-09 LAB — BASIC METABOLIC PANEL WITH GFR
BUN: 12 mg/dL (ref 6–23)
CO2: 28 meq/L (ref 19–32)
Calcium: 8.7 mg/dL (ref 8.4–10.5)
Chloride: 99 meq/L (ref 96–112)
Creatinine, Ser: 0.75 mg/dL (ref 0.40–1.20)
GFR: 97.72 mL/min (ref >60.00)
Glucose, Bld: 140 mg/dL — ABNORMAL HIGH (ref 70–99)
Potassium: 3.3 meq/L — ABNORMAL LOW (ref 3.5–5.1)
Sodium: 139 meq/L (ref 135–145)

## 2023-11-09 MED ORDER — LOSARTAN POTASSIUM 25 MG PO TABS: 25.0000 mg | ORAL_TABLET | Freq: Every day | ORAL | 0 refills | Status: DC

## 2023-11-09 MED ORDER — POTASSIUM CHLORIDE ER 10 MEQ PO TBCR: 10.0000 meq | EXTENDED_RELEASE_TABLET | Freq: Every day | ORAL | 1 refills | Status: DC

## 2023-11-09 NOTE — Assessment & Plan Note (Signed)
 bp is improved  BP Readings from Last 1 Encounters:  11/09/23 (!) 144/80   Instructed to continue hydrochlorothiazide  25 mg daily  Add losartan  25 mg daily   Most recent labs reviewed  Bmet today  Disc lifstyle change with low sodium diet and exercise  Commended on 6 lb weight loss so far  Encouraged to keep working on it   Follow up in 1 mo

## 2023-11-09 NOTE — Patient Instructions (Addendum)
 Labs today   Continue hydrochlorothiazide  25 mg daily  Add losartan  25 mg daily - take it at the same time   If any side effects let us  know   Blood pressure is coming down   Keep working on the lifestyle change and weight loss   Follow up in a month  ' I would like to get an ultrasound of your breast (the lesion on right breast) I will put an order in   Please call for appointment:   [x]   [facility]  [address] [place] [address]  [phone]  []   [facility])   [address]  [place], [place] [address]  [phone]  []   [facility]      [address] [place], [place]        [phone]         []   [facility]  [address] [place], [place]  [phone]  []  [facility]   [address]   [place], [place] [address]  [phone]  []  [facility]  [address] [place], [place] [address] [phone]

## 2023-11-09 NOTE — Progress Notes (Signed)
 Subjective:    Patient ID: Tracey Cunningham, female    DOB: [birth year], 43 y.o.   MRN: [id number]  HPI  Wt Readings from Last 3 Encounters:  11/09/23 (!) 371 lb 4 oz (168.4 kg)  10/26/23 (!) 378 lb (171.5 kg)  04/25/23 (!) 369 lb (167.4 kg)   60.84 kg/m  Vitals:   11/09/23 0801 11/09/23 0816  BP: (!) 144/86 (!) 144/80  Pulse: 99   Temp: 98.1 F (36.7 C)   SpO2: 95%     Pt presents for follow up of HTN   Last visit noted blood pressure trending up  Discussed lifestyle change  Was starting to work with a nutritionist   We started hydrochlorothiazide  25 mg daily  Tolerating it well    bp is stable today  No cp or palpitations or headaches or edema  No side effects to medicines  BP Readings from Last 3 Encounters:  11/09/23 (!) 144/80  10/26/23 (!) 156/88  08/04/22 (!) 154/80     Lab Results  Component Value Date   NA 139 11/09/2023   K 3.3 (L) 11/09/2023   CO2 28 11/09/2023   GLUCOSE 140 (H) 11/09/2023   BUN 12 11/09/2023   CREATININE 0.75 11/09/2023   CALCIUM 8.7 11/09/2023   GFR 97.72 11/09/2023   GFRNONAA 93 08/28/2006    Is working on lifestyle change  Has lost 6 lb   Exercise -pedaling  Is careful - can hurt her back   Area on breast looks about the same       Patient Active Problem List   Diagnosis Date Noted   Encounter for hepatitis C screening test for low risk patient 10/26/2023   Encounter for screening for HIV 10/26/2023   Diabetes mellitus screening 10/26/2023   Lesion of breast 10/26/2023   Acute left-sided low back pain with left-sided sciatica 02/18/2022   Liver lesion 09/28/2020   Pulmonary nodule 08/23/2020   Lower abdominal pain 05/08/2020   Left groin pain 04/10/2020   Elevated liver transaminase level 01/01/2019   Lump on finger, right 10/10/2018   Family history of breast cancer in mother 02/02/2016   GERD (gastroesophageal reflux disease) 11/05/2014   Encounter for routine gynecological examination 01/31/2012    Routine general medical examination at a health care facility 01/31/2012   Cerumen impaction 05/14/2009   IRRITABLE BOWEL SYNDROME 04/23/2008   Morbid obesity (HCC) 04/19/2007   ANEMIA-IRON DEFICIENCY 08/24/2006   Essential hypertension 08/24/2006   MENORRHALGIA 08/24/2006   Past Medical History:  Diagnosis Date   Anemia    iron deficiency    Cerumen impaction    Family history of breast cancer    5/21 cancer genetic testing letter sent   Hypertension    borderline   Irregular periods    OBGYN- [facility]    Obesity    weight loss with diet and exercise    Past Surgical History:  Procedure Laterality Date   APPENDECTOMY  10/2005   CHOLECYSTECTOMY     foot fracture     right foot    Social History   Tobacco Use   Smoking status: Never   Smokeless tobacco: Never  Vaping Use   Vaping status: Never Used  Substance Use Topics   Alcohol use: Yes    Alcohol/week: 0.0 standard drinks of alcohol    Comment: wine- occ   Drug use: No   Family History  Problem Relation Age of Onset   Hypertension Maternal Grandfather    Heart  attack Maternal Grandfather    Breast cancer Mother 29       triple neg   Breast cancer Maternal Grandmother        70's   Breast cancer Paternal Grandmother        69's   Alcohol abuse Father    No Known Allergies Current Outpatient Medications on File Prior to Visit  Medication Sig Dispense Refill   famotidine (PEPCID) 20 MG tablet Take 20 mg by mouth as needed.     gabapentin  (NEURONTIN ) 300 MG capsule Take 2 capsules by mouth 3 (three) times daily.     hydrochlorothiazide  (HYDRODIURIL ) 25 MG tablet Take 1 tablet (25 mg total) by mouth daily. 90 tablet 0   No current facility-administered medications on file prior to visit.    Review of Systems  Constitutional:  Negative for activity change, appetite change, fatigue, fever and unexpected weight change.  HENT:  Negative for congestion, ear pain, rhinorrhea, sinus pressure and sore throat.    Eyes:  Negative for pain, redness and visual disturbance.  Respiratory:  Negative for cough, shortness of breath and wheezing.   Cardiovascular:  Negative for chest pain and palpitations.  Gastrointestinal:  Negative for abdominal pain, blood in stool, constipation and diarrhea.  Endocrine: Negative for polydipsia and polyuria.  Genitourinary:  Negative for dysuria, frequency and urgency.  Musculoskeletal:  Negative for arthralgias, back pain and myalgias.  Skin:  Negative for pallor and rash.  Allergic/Immunologic: Negative for environmental allergies.  Neurological:  Negative for dizziness, syncope and headaches.  Hematological:  Negative for adenopathy. Does not bruise/bleed easily.  Psychiatric/Behavioral:  Negative for decreased concentration and dysphoric mood. The patient is not nervous/anxious.        Objective:   Physical Exam Constitutional:      General: She is not in acute distress.    Appearance: Normal appearance. She is well-developed. She is obese. She is not ill-appearing or diaphoretic.  HENT:     Head: Normocephalic and atraumatic.  Eyes:     Conjunctiva/sclera: Conjunctivae normal.     Pupils: Pupils are equal, round, and reactive to light.  Neck:     Thyroid: No thyromegaly.     Vascular: No carotid bruit or JVD.  Cardiovascular:     Rate and Rhythm: Normal rate and regular rhythm.     Heart sounds: Normal heart sounds.     No gallop.  Pulmonary:     Effort: Pulmonary effort is normal. No respiratory distress.     Breath sounds: Normal breath sounds. No wheezing or rales.  Abdominal:     General: There is no distension or abdominal bruit.     Palpations: Abdomen is soft.  Genitourinary:    Comments: No change in lesoin on right lower lateral breast  2 cm very superficial raised area - mild erythema and some blue/bruised appearance  No tenderness  No mass  Musculoskeletal:     Cervical back: Normal range of motion and neck supple.     Right lower  leg: No edema.     Left lower leg: No edema.  Lymphadenopathy:     Cervical: No cervical adenopathy.  Skin:    General: Skin is warm and dry.     Coloration: Skin is not pale.     Findings: No rash.  Neurological:     Mental Status: She is alert.     Coordination: Coordination normal.     Deep Tendon Reflexes: Reflexes are normal and symmetric. Reflexes normal.  Psychiatric:        Mood and Affect: Mood normal.           Assessment & Plan:   Problem List Items Addressed This Visit       Cardiovascular and Mediastinum   Essential hypertension - Primary   bp is improved  BP Readings from Last 1 Encounters:  11/09/23 (!) 144/80   Instructed to continue hydrochlorothiazide  25 mg daily  Add losartan  25 mg daily   Most recent labs reviewed  Bmet today  Disc lifstyle change with low sodium diet and exercise  Commended on 6 lb weight loss so far  Encouraged to keep working on it   Follow up in 1 mo        Relevant Medications   losartan  (COZAAR ) 25 MG tablet   Other Relevant Orders   Basic metabolic panel with GFR (Completed)     Other   Lesion of breast   Right inferior/lateral breast  Very superficial /appears vascular or bruised  Non tender Present since July   Will order US  to better evaluate

## 2023-11-09 NOTE — Assessment & Plan Note (Signed)
 Right inferior/lateral breast  Very superficial /appears vascular or bruised  Non tender Present since July   Will order US  to better evaluate

## 2023-11-10 ENCOUNTER — Telehealth: Payer: Self-pay | Admitting: *Deleted

## 2023-11-10 NOTE — Telephone Encounter (Signed)
 Pt viewed lab results via mychart but per Dr. Randeen:  Re check bmet in 1-2 weeks   Please schedule non fasting lab appt. Thanks

## 2023-11-17 ENCOUNTER — Other Ambulatory Visit

## 2023-11-17 ENCOUNTER — Ambulatory Visit
Admission: RE | Admit: 2023-11-17 | Discharge: 2023-11-17 | Disposition: A | Discharge status: Home / Self Care | Source: Ambulatory Visit | Attending: Family Medicine | Admitting: Family Medicine

## 2023-11-17 DIAGNOSIS — E876 Hypokalemia: Secondary | ICD-10-CM

## 2023-11-17 DIAGNOSIS — N649 Disorder of breast, unspecified: Secondary | ICD-10-CM | POA: Diagnosis not present

## 2023-11-17 DIAGNOSIS — M799 Soft tissue disorder, unspecified: Secondary | ICD-10-CM | POA: Diagnosis not present

## 2023-11-17 DIAGNOSIS — R92321 Mammographic fibroglandular density, right breast: Secondary | ICD-10-CM | POA: Diagnosis not present

## 2023-11-17 LAB — BASIC METABOLIC PANEL WITH GFR
BUN: 11 mg/dL (ref 7–25)
CO2: 24 mmol/L (ref 20–32)
Calcium: 8.9 mg/dL (ref 8.6–10.2)
Chloride: 103 mmol/L (ref 98–110)
Creat: 0.66 mg/dL (ref 0.50–0.99)
Glucose, Bld: 94 mg/dL (ref 65–99)
Potassium: 3.6 mmol/L (ref 3.5–5.3)
Sodium: 137 mmol/L (ref 135–146)
eGFR: 112 mL/min/1.73m2 (ref >=60)

## 2023-11-19 ENCOUNTER — Ambulatory Visit: Payer: Self-pay | Admitting: Family Medicine

## 2023-11-19 DIAGNOSIS — E876 Hypokalemia: Secondary | ICD-10-CM

## 2023-11-19 DIAGNOSIS — N649 Disorder of breast, unspecified: Secondary | ICD-10-CM

## 2023-11-19 MED ORDER — POTASSIUM CHLORIDE ER 10 MEQ PO TBCR: 10.0000 meq | EXTENDED_RELEASE_TABLET | Freq: Every day | ORAL | 2 refills | Status: AC

## 2023-11-24 DIAGNOSIS — Z713 Dietary counseling and surveillance: Secondary | ICD-10-CM | POA: Diagnosis not present

## 2023-12-01 DIAGNOSIS — Z713 Dietary counseling and surveillance: Secondary | ICD-10-CM | POA: Diagnosis not present

## 2023-12-08 DIAGNOSIS — Z713 Dietary counseling and surveillance: Secondary | ICD-10-CM | POA: Diagnosis not present

## 2023-12-11 ENCOUNTER — Encounter: Payer: Self-pay | Admitting: Family Medicine

## 2023-12-11 ENCOUNTER — Ambulatory Visit: Admitting: Family Medicine

## 2023-12-11 VITALS — BP 135/62 | HR 113 | Temp 98.6°F | Ht 65.5 in | Wt 375.5 lb

## 2023-12-11 DIAGNOSIS — I890 Lymphedema, not elsewhere classified: Secondary | ICD-10-CM | POA: Insufficient documentation

## 2023-12-11 DIAGNOSIS — I10 Essential (primary) hypertension: Secondary | ICD-10-CM

## 2023-12-11 MED ORDER — LOSARTAN POTASSIUM 50 MG PO TABS: 50.0000 mg | ORAL_TABLET | Freq: Every day | ORAL | 0 refills | Status: DC

## 2023-12-11 NOTE — Assessment & Plan Note (Addendum)
 Bilateral ankles  Not pitting  Suspect due to obesity   Encouraged use of supp socks if tolerated

## 2023-12-11 NOTE — Assessment & Plan Note (Signed)
 bp is improving with addition of losartan   BP Readings from Last 1 Encounters:  12/11/23 135/62   No changes needed Most recent labs reviewed  Disc lifstyle change with low sodium diet and exercise   Making progress with diet and exercise also  Encouraged to keep working on it  Continue Hydrochlorothiazide  25 mg daily  Increase losartan  to 50 mg daily (tolerates well)  Follow up in 2-4 wk for visit and will do bmet at that time   Handout given- DASH eating plan

## 2023-12-11 NOTE — Progress Notes (Signed)
 Subjective:    Patient ID: Tracey Cunningham, female    DOB: 01/09/1981, 43 y.o.   MRN: [id number]  HPI  Wt Readings from Last 3 Encounters:  12/11/23 (!) 375 lb 8 oz (170.3 kg)  11/09/23 (!) 371 lb 4 oz (168.4 kg)  10/26/23 (!) 378 lb (171.5 kg)   61.54 kg/m  Vitals:   12/11/23 1526 12/11/23 1546  BP: (!) 148/74 135/62  Pulse: (!) 113   Temp: 98.6 F (37 C)   SpO2: 95%     Pt presents for follow up of HTN  Morbid obesity   At last visit had started seeing a nutritionist     HTN bp is stable today  No cp or palpitations or headaches or edema  No side effects to medicines  BP Readings from Last 3 Encounters:  12/11/23 135/62  11/09/23 (!) 144/80  10/26/23 (!) 156/88    Hydrochlorothiazide  25 mg daily  Losartan  25 mg daily added last time   No side effects or problems   Feeling pretty good   Doing well with diet /exercise  Still learning what she can do /push exercise Has a sit down dance program to do   Doing well with less processed foods  Chooses no salt added      Lab Results  Component Value Date   NA 137 11/17/2023   K 3.6 11/17/2023   CO2 24 11/17/2023   GLUCOSE 94 11/17/2023   BUN 11 11/17/2023   CREATININE 0.66 11/17/2023   CALCIUM 8.9 11/17/2023   GFR 97.72 11/09/2023   EGFR 112 11/17/2023   GFRNONAA 93 08/28/2006      Patient Active Problem List   Diagnosis Date Noted   Lymphedema 12/11/2023   Hypokalemia 11/09/2023   Encounter for hepatitis C screening test for low risk patient 10/26/2023   Encounter for screening for HIV 10/26/2023   Diabetes mellitus screening 10/26/2023   Lesion of breast 10/26/2023   Acute left-sided low back pain with left-sided sciatica 02/18/2022   Liver lesion 09/28/2020   Pulmonary nodule 08/23/2020   Lower abdominal pain 05/08/2020   Left groin pain 04/10/2020   Elevated liver transaminase level 01/01/2019   Lump on finger, right 10/10/2018   Family history of breast cancer in mother  02/02/2016   GERD (gastroesophageal reflux disease) 11/05/2014   Encounter for routine gynecological examination 01/31/2012   Routine general medical examination at a health care facility 01/31/2012   Cerumen impaction 05/14/2009   IRRITABLE BOWEL SYNDROME 04/23/2008   Morbid obesity (HCC) 04/19/2007   ANEMIA-IRON DEFICIENCY 08/24/2006   Essential hypertension 08/24/2006   MENORRHALGIA 08/24/2006   Past Medical History:  Diagnosis Date   Anemia    iron deficiency    Cerumen impaction    Family history of breast cancer    5/21 cancer genetic testing letter sent   Hypertension    borderline   Irregular periods    [facility]    Obesity    weight loss with diet and exercise    Past Surgical History:  Procedure Laterality Date   APPENDECTOMY  10/2005   CHOLECYSTECTOMY     foot fracture     right foot    Social History   Tobacco Use   Smoking status: Never   Smokeless tobacco: Never  Vaping Use   Vaping status: Never Used  Substance Use Topics   Alcohol use: Yes    Alcohol/week: 0.0 standard drinks of alcohol    Comment: wine- occ  Drug use: No   Family History  Problem Relation Age of Onset   Hypertension Maternal Grandfather    Heart attack Maternal Grandfather    Breast cancer Mother 47       triple neg   Breast cancer Maternal Grandmother        70's   Breast cancer Paternal Grandmother        6's   Alcohol abuse Father    No Known Allergies Current Outpatient Medications on File Prior to Visit  Medication Sig Dispense Refill   famotidine (PEPCID) 20 MG tablet Take 20 mg by mouth as needed.     gabapentin  (NEURONTIN ) 300 MG capsule Take 2 capsules by mouth 3 (three) times daily.     hydrochlorothiazide  (HYDRODIURIL ) 25 MG tablet Take 1 tablet (25 mg total) by mouth daily. 90 tablet 0   potassium chloride  (KLOR-CON  10) 10 MEQ tablet Take 1 tablet (10 mEq total) by mouth daily. 90 tablet 2   No current facility-administered medications on file prior  to visit.    Review of Systems  Constitutional:  Negative for activity change, appetite change, fatigue, fever and unexpected weight change.  HENT:  Negative for congestion, ear pain, rhinorrhea, sinus pressure and sore throat.   Eyes:  Negative for pain, redness and visual disturbance.  Respiratory:  Negative for cough, shortness of breath and wheezing.   Cardiovascular:  Negative for chest pain and palpitations.  Gastrointestinal:  Negative for abdominal pain, blood in stool, constipation and diarrhea.  Endocrine: Negative for polydipsia and polyuria.  Genitourinary:  Negative for dysuria, frequency and urgency.  Musculoskeletal:  Negative for arthralgias, back pain and myalgias.  Skin:  Negative for pallor and rash.  Allergic/Immunologic: Negative for environmental allergies.  Neurological:  Negative for dizziness, syncope and headaches.  Hematological:  Negative for adenopathy. Does not bruise/bleed easily.  Psychiatric/Behavioral:  Negative for decreased concentration and dysphoric mood. The patient is not nervous/anxious.        Objective:   Physical Exam Constitutional:      General: She is not in acute distress.    Appearance: Normal appearance. She is well-developed. She is obese. She is not ill-appearing or diaphoretic.  HENT:     Head: Normocephalic and atraumatic.  Eyes:     Conjunctiva/sclera: Conjunctivae normal.     Pupils: Pupils are equal, round, and reactive to light.  Neck:     Thyroid: No thyromegaly.     Vascular: No carotid bruit or JVD.  Cardiovascular:     Rate and Rhythm: Normal rate and regular rhythm.     Heart sounds: Normal heart sounds.     No gallop.  Pulmonary:     Effort: Pulmonary effort is normal. No respiratory distress.     Breath sounds: Normal breath sounds. No wheezing or rales.  Abdominal:     General: There is no distension or abdominal bruit.     Palpations: Abdomen is soft.  Musculoskeletal:     Cervical back: Normal range of  motion and neck supple.     Right lower leg: Edema present.     Left lower leg: Edema present.     Comments: Baseline ankle lymphedema   Lymphadenopathy:     Cervical: No cervical adenopathy.  Skin:    General: Skin is warm and dry.     Coloration: Skin is not pale.     Findings: No rash.  Neurological:     Mental Status: She is alert.     Coordination:  Coordination normal.     Deep Tendon Reflexes: Reflexes are normal and symmetric. Reflexes normal.  Psychiatric:        Mood and Affect: Mood normal.           Assessment & Plan:   Problem List Items Addressed This Visit       Cardiovascular and Mediastinum   Essential hypertension - Primary   bp is improving with addition of losartan   BP Readings from Last 1 Encounters:  12/11/23 135/62   No changes needed Most recent labs reviewed  Disc lifstyle change with low sodium diet and exercise   Making progress with diet and exercise also  Encouraged to keep working on it  Continue Hydrochlorothiazide  25 mg daily  Increase losartan  to 50 mg daily (tolerates well)  Follow up in 2-4 wk for visit and will do bmet at that time   Handout given- DASH eating plan         Relevant Medications   losartan  (COZAAR ) 50 MG tablet     Other   Morbid obesity (HCC)   Continues to work with nutritionist  Limiting processed foods  Starting process of trying a chair exercise program       Lymphedema   Bilateral ankles  Not pitting  Suspect due to obesity   Encouraged use of supp socks if tolerated

## 2023-12-11 NOTE — Patient Instructions (Addendum)
 Go up on losartan  to 50 mg once daily (2 of the 25s)  Continue the hydrochlorothiazide     Continue better diet /exercise    If you can check blood pressure at home- do so  Either a large arm cuff or wrist cuff with  (resting at heart level, when relaxed)    Follow up here in 2-4 weeks for visit and labs

## 2023-12-11 NOTE — Assessment & Plan Note (Signed)
 Continues to work with nutritionist  Limiting processed foods  Starting process of trying a chair exercise program

## 2023-12-22 DIAGNOSIS — Z713 Dietary counseling and surveillance: Secondary | ICD-10-CM | POA: Diagnosis not present

## 2023-12-25 ENCOUNTER — Encounter: Payer: Self-pay | Admitting: Family Medicine

## 2023-12-29 DIAGNOSIS — Z713 Dietary counseling and surveillance: Secondary | ICD-10-CM | POA: Diagnosis not present

## 2024-01-03 ENCOUNTER — Encounter: Payer: Self-pay | Admitting: Family Medicine

## 2024-01-03 ENCOUNTER — Ambulatory Visit: Admitting: Family Medicine

## 2024-01-03 VITALS — BP 133/80 | HR 110 | Temp 98.8°F | Ht 65.5 in | Wt 374.4 lb

## 2024-01-03 DIAGNOSIS — I10 Essential (primary) hypertension: Secondary | ICD-10-CM

## 2024-01-03 DIAGNOSIS — R7401 Elevation of levels of liver transaminase levels: Secondary | ICD-10-CM

## 2024-01-03 DIAGNOSIS — E876 Hypokalemia: Secondary | ICD-10-CM

## 2024-01-03 NOTE — Assessment & Plan Note (Signed)
 Bmet today  Continues losartan  50 and hydrochlorothiazide  25 mg daily   Klor con 10 meq daily

## 2024-01-03 NOTE — Assessment & Plan Note (Signed)
 Pt is working on diet/exercise for weight loss  LFT today  No symptoms

## 2024-01-03 NOTE — Assessment & Plan Note (Signed)
 Discussed how this problem influences overall health and the risks it imposes  Reviewed plan for weight loss with lower calorie diet (via better food choices (lower glycemic and portion control) along with exercise building up to or more than 30 minutes 5 days per week including some aerobic activity and strength training   Continues to work with nutritionist  Also some seated and standing exercise  Encouraged to add strength training

## 2024-01-03 NOTE — Progress Notes (Signed)
 Subjective:    Patient ID: Tracey Cunningham, female    DOB: [birth year], 43 y.o.   MRN: [id number]  HPI  Wt Readings from Last 3 Encounters:  01/03/24 (!) 374 lb 6 oz (169.8 kg)  12/11/23 (!) 375 lb 8 oz (170.3 kg)  11/09/23 (!) 371 lb 4 oz (168.4 kg)   61.35 kg/m  Vitals:   01/03/24 1555 01/03/24 1612  BP: (!) 160/84 133/80  Pulse: (!) 110   Temp: 98.8 F (37.1 C)   SpO2: 95%     Pt presents for follow up of  HTN  bp is stable today  No cp or palpitations or headaches or edema  No side effects to medicines  BP Readings from Last 3 Encounters:  01/03/24 133/80  12/11/23 135/62  11/09/23 (!) 144/80    Hydrochlorothiazide  25 mg daily  Losartan  25 mg daily (went up to 50 and she felt dizzy when standing up)   Last visit given DASH eating handout  Was working with nutritionist   Blood pressure at home is usually low 130s systolic     Dancing program/ sitting/standing Needs to get back to weights   Eating better  Consistency is biggest challenge  Hard to get herself to eat first thing n am   More protein  Lots of vegetables  Produce in general      Lab Results  Component Value Date   NA 137 11/17/2023   K 3.6 11/17/2023   CO2 24 11/17/2023   GLUCOSE 94 11/17/2023   BUN 11 11/17/2023   CREATININE 0.66 11/17/2023   CALCIUM 8.9 11/17/2023   GFR 97.72 11/09/2023   EGFR 112 11/17/2023   GFRNONAA 93 08/28/2006      Lab Results  Component Value Date   WBC 7.4 10/26/2023   HGB 11.8 (L) 10/26/2023   HCT 37.5 10/26/2023   MCV 75.9 (L) 10/26/2023   PLT 261.0 10/26/2023   Lab Results  Component Value Date   ALT 124 (H) 10/26/2023   AST 65 (H) 10/26/2023   ALKPHOS 95 10/26/2023   BILITOT 0.6 10/26/2023   Lab Results  Component Value Date   TSH 2.56 10/26/2023    Lab Results  Component Value Date   CHOL 162 10/26/2023   HDL 43.00 10/26/2023   LDLCALC 94 10/26/2023   TRIG 125.0 10/26/2023   CHOLHDL 4 10/26/2023   Lab Results   Component Value Date   HGBA1C 6.4 10/26/2023    Patient Active Problem List   Diagnosis Date Noted   Lymphedema 12/11/2023   Hypokalemia 11/09/2023   Encounter for hepatitis C screening test for low risk patient 10/26/2023   Encounter for screening for HIV 10/26/2023   Diabetes mellitus screening 10/26/2023   Lesion of breast 10/26/2023   Acute left-sided low back pain with left-sided sciatica 02/18/2022   Liver lesion 09/28/2020   Pulmonary nodule 08/23/2020   Elevated liver transaminase level 01/01/2019   Family history of breast cancer in mother 02/02/2016   GERD (gastroesophageal reflux disease) 11/05/2014   Encounter for routine gynecological examination 01/31/2012   Routine general medical examination at a health care facility 01/31/2012   Cerumen impaction 05/14/2009   IRRITABLE BOWEL SYNDROME 04/23/2008   Morbid obesity (HCC) 04/19/2007   ANEMIA-IRON DEFICIENCY 08/24/2006   Essential hypertension 08/24/2006   MENORRHALGIA 08/24/2006   Past Medical History:  Diagnosis Date   Anemia    iron deficiency    Cerumen impaction    Family history of breast cancer  5/21 cancer genetic testing letter sent   Hypertension    borderline   Irregular periods    [facility]    Obesity    weight loss with diet and exercise    Past Surgical History:  Procedure Laterality Date   APPENDECTOMY  10/2005   CHOLECYSTECTOMY     foot fracture     right foot    Social History   Tobacco Use   Smoking status: Never   Smokeless tobacco: Never  Vaping Use   Vaping status: Never Used  Substance Use Topics   Alcohol use: Yes    Alcohol/week: 0.0 standard drinks of alcohol    Comment: wine- occ   Drug use: No   Family History  Problem Relation Age of Onset   Hypertension Maternal Grandfather    Heart attack Maternal Grandfather    Breast cancer Mother 45       triple neg   Breast cancer Maternal Grandmother        70's   Breast cancer Paternal Grandmother         15's   Alcohol abuse Father    No Known Allergies Current Outpatient Medications on File Prior to Visit  Medication Sig Dispense Refill   famotidine (PEPCID) 20 MG tablet Take 20 mg by mouth as needed.     gabapentin  (NEURONTIN ) 300 MG capsule Take 2 capsules by mouth 3 (three) times daily.     hydrochlorothiazide  (HYDRODIURIL ) 25 MG tablet Take 1 tablet (25 mg total) by mouth daily. 90 tablet 0   losartan  (COZAAR ) 50 MG tablet Take 1 tablet (50 mg total) by mouth daily. (Patient taking differently: Take 25 mg by mouth daily.) 90 tablet 0   potassium chloride  (KLOR-CON  10) 10 MEQ tablet Take 1 tablet (10 mEq total) by mouth daily. 90 tablet 2   No current facility-administered medications on file prior to visit.    Review of Systems  Constitutional:  Negative for activity change, appetite change, fatigue, fever and unexpected weight change.  HENT:  Negative for congestion, ear pain, rhinorrhea, sinus pressure and sore throat.   Eyes:  Negative for pain, redness and visual disturbance.  Respiratory:  Negative for cough, shortness of breath and wheezing.   Cardiovascular:  Negative for chest pain and palpitations.  Gastrointestinal:  Negative for abdominal pain, blood in stool, constipation and diarrhea.  Endocrine: Negative for polydipsia and polyuria.  Genitourinary:  Negative for dysuria, frequency and urgency.  Musculoskeletal:  Negative for arthralgias, back pain and myalgias.  Skin:  Negative for pallor and rash.  Allergic/Immunologic: Negative for environmental allergies.  Neurological:  Negative for dizziness, syncope and headaches.  Hematological:  Negative for adenopathy. Does not bruise/bleed easily.  Psychiatric/Behavioral:  Negative for decreased concentration and dysphoric mood. The patient is not nervous/anxious.        Objective:   Physical Exam Constitutional:      General: She is not in acute distress.    Appearance: Normal appearance. She is well-developed. She  is obese. She is not ill-appearing or diaphoretic.  HENT:     Head: Normocephalic and atraumatic.  Eyes:     Conjunctiva/sclera: Conjunctivae normal.     Pupils: Pupils are equal, round, and reactive to light.  Neck:     Thyroid: No thyromegaly.     Vascular: No carotid bruit or JVD.  Cardiovascular:     Rate and Rhythm: Normal rate and regular rhythm.     Heart sounds: Normal heart sounds.  No gallop.  Pulmonary:     Effort: Pulmonary effort is normal. No respiratory distress.     Breath sounds: Normal breath sounds. No stridor. No wheezing, rhonchi or rales.  Abdominal:     General: There is no distension or abdominal bruit.     Palpations: Abdomen is soft.  Musculoskeletal:     Cervical back: Normal range of motion and neck supple.     Comments: Baseline lymphedema   Lymphadenopathy:     Cervical: No cervical adenopathy.  Skin:    General: Skin is warm and dry.     Coloration: Skin is not pale.     Findings: No rash.  Neurological:     Mental Status: She is alert.     Coordination: Coordination normal.     Deep Tendon Reflexes: Reflexes are normal and symmetric. Reflexes normal.  Psychiatric:        Mood and Affect: Mood normal.           Assessment & Plan:   Problem List Items Addressed This Visit       Cardiovascular and Mediastinum   Essential hypertension - Primary   bp in fair control at this time  BP Readings from Last 1 Encounters:  01/03/24 133/80   No changes needed Most recent labs reviewed  Disc lifstyle change with low sodium diet and exercise  Did not tolerate losartan  100 mg (light headed/orthostatic) , now back on 50 mg dail  Hydrochlorothiazide  25 mg daily   Bmet today Continues to work on diet and exercise for weight loss F.u 3 mo       Relevant Orders   Basic metabolic panel with GFR   Hepatic function panel     Other   Morbid obesity (HCC)   Discussed how this problem influences overall health and the risks it imposes   Reviewed plan for weight loss with lower calorie diet (via better food choices (lower glycemic and portion control) along with exercise building up to or more than 30 minutes 5 days per week including some aerobic activity and strength training   Continues to work with nutritionist  Also some seated and standing exercise  Encouraged to add strength training       Hypokalemia   Bmet today  Continues losartan  50 and hydrochlorothiazide  25 mg daily   Klor con 10 meq daily        Relevant Orders   Basic metabolic panel with GFR   Elevated liver transaminase level   Pt is working on diet/exercise for weight loss  LFT today  No symptoms       Relevant Orders   Hepatic function panel

## 2024-01-03 NOTE — Assessment & Plan Note (Signed)
 bp in fair control at this time  BP Readings from Last 1 Encounters:  01/03/24 133/80   No changes needed Most recent labs reviewed  Disc lifstyle change with low sodium diet and exercise  Did not tolerate losartan  100 mg (light headed/orthostatic) , now back on 50 mg dail  Hydrochlorothiazide  25 mg daily   Bmet today Continues to work on diet and exercise for weight loss F.u 3 mo

## 2024-01-03 NOTE — Patient Instructions (Addendum)
 Keep up the exercise  Add some strength training to your routine, this is important for bone and brain health and can reduce your risk of falls and help your body use insulin properly and regulate weight  Light weights, exercise bands , and internet videos are a good way to start  Yoga (chair or regular), machines , floor exercises or a gym with machines are also good options    Keep up the good work with diet  Avoid added sugars in your diet when you can  Try to get most of your carbohydrates from produce (with the exception of white potatoes) and whole grains Eat less bread/pasta/rice/snack foods/cereals/sweets and other items from the middle of the grocery store (processed carbs)  Lab today for chemistries and liver function   Follow up in 3 months

## 2024-01-04 ENCOUNTER — Ambulatory Visit: Payer: Self-pay | Admitting: Family Medicine

## 2024-01-04 DIAGNOSIS — R7401 Elevation of levels of liver transaminase levels: Secondary | ICD-10-CM

## 2024-01-04 LAB — BASIC METABOLIC PANEL WITH GFR
BUN: 10 mg/dL (ref 6–23)
CO2: 30 meq/L (ref 19–32)
Calcium: 9.1 mg/dL (ref 8.4–10.5)
Chloride: 100 meq/L (ref 96–112)
Creatinine, Ser: 0.67 mg/dL (ref 0.40–1.20)
GFR: 107.17 mL/min (ref >60.00)
Glucose, Bld: 131 mg/dL — ABNORMAL HIGH (ref 70–99)
Potassium: 3.9 meq/L (ref 3.5–5.1)
Sodium: 138 meq/L (ref 135–145)

## 2024-01-04 LAB — HEPATIC FUNCTION PANEL
ALT: 160 U/L — ABNORMAL HIGH (ref 0–35)
AST: 82 U/L — ABNORMAL HIGH (ref 0–37)
Albumin: 4.1 g/dL (ref 3.5–5.2)
Alkaline Phosphatase: 98 U/L (ref 39–117)
Bilirubin, Direct: 0.1 mg/dL (ref 0.0–0.3)
Total Bilirubin: 0.5 mg/dL (ref 0.2–1.2)
Total Protein: 7 g/dL (ref 6.0–8.3)

## 2024-01-05 DIAGNOSIS — Z713 Dietary counseling and surveillance: Secondary | ICD-10-CM | POA: Diagnosis not present

## 2024-01-09 ENCOUNTER — Other Ambulatory Visit: Payer: Self-pay | Admitting: Family Medicine

## 2024-01-09 DIAGNOSIS — E876 Hypokalemia: Secondary | ICD-10-CM

## 2024-01-18 ENCOUNTER — Ambulatory Visit
Admission: RE | Admit: 2024-01-18 | Discharge: 2024-01-18 | Disposition: A | Source: Ambulatory Visit | Attending: Family Medicine | Admitting: Family Medicine

## 2024-01-18 DIAGNOSIS — R7401 Elevation of levels of liver transaminase levels: Secondary | ICD-10-CM

## 2024-01-18 DIAGNOSIS — R161 Splenomegaly, not elsewhere classified: Secondary | ICD-10-CM | POA: Diagnosis not present

## 2024-01-20 ENCOUNTER — Other Ambulatory Visit: Payer: Self-pay | Admitting: Family Medicine

## 2024-01-21 ENCOUNTER — Ambulatory Visit: Payer: Self-pay | Admitting: Family Medicine

## 2024-01-21 DIAGNOSIS — K760 Fatty (change of) liver, not elsewhere classified: Secondary | ICD-10-CM

## 2024-01-21 DIAGNOSIS — R161 Splenomegaly, not elsewhere classified: Secondary | ICD-10-CM

## 2024-01-21 DIAGNOSIS — R7401 Elevation of levels of liver transaminase levels: Secondary | ICD-10-CM

## 2024-01-22 ENCOUNTER — Telehealth: Admitting: Physician Assistant

## 2024-01-22 DIAGNOSIS — J069 Acute upper respiratory infection, unspecified: Secondary | ICD-10-CM | POA: Diagnosis not present

## 2024-01-22 MED ORDER — FLUTICASONE PROPIONATE 50 MCG/ACT NA SUSP: 2.0000 | Freq: Every day | NASAL | 0 refills | Status: DC

## 2024-01-22 MED ORDER — PROMETHAZINE-DM 6.25-15 MG/5ML PO SYRP: 5.0000 mL | ORAL_SOLUTION | Freq: Four times a day (QID) | ORAL | 0 refills | Status: DC | PRN

## 2024-01-22 NOTE — Progress Notes (Signed)

## 2024-01-24 DIAGNOSIS — K760 Fatty (change of) liver, not elsewhere classified: Secondary | ICD-10-CM | POA: Insufficient documentation

## 2024-01-24 DIAGNOSIS — R161 Splenomegaly, not elsewhere classified: Secondary | ICD-10-CM | POA: Insufficient documentation

## 2024-02-05 ENCOUNTER — Ambulatory Visit

## 2024-02-09 DIAGNOSIS — Z713 Dietary counseling and surveillance: Secondary | ICD-10-CM | POA: Diagnosis not present

## 2024-02-15 ENCOUNTER — Other Ambulatory Visit: Payer: Self-pay | Admitting: Family Medicine

## 2024-02-23 DIAGNOSIS — Z713 Dietary counseling and surveillance: Secondary | ICD-10-CM | POA: Diagnosis not present

## 2024-03-01 DIAGNOSIS — Z713 Dietary counseling and surveillance: Secondary | ICD-10-CM | POA: Diagnosis not present

## 2024-03-10 ENCOUNTER — Other Ambulatory Visit: Payer: Self-pay | Admitting: Family Medicine

## 2024-04-15 ENCOUNTER — Ambulatory Visit

## 2024-04-17 ENCOUNTER — Ambulatory Visit: Admitting: Family Medicine

## 2024-04-17 ENCOUNTER — Ambulatory Visit

## 2024-04-17 ENCOUNTER — Encounter: Payer: Self-pay | Admitting: Family Medicine

## 2024-04-17 VITALS — BP 132/81 | HR 99 | Temp 98.1°F | Ht 65.5 in | Wt 365.1 lb

## 2024-04-17 VITALS — BP 135/70 | HR 102 | Temp 97.7°F | Ht 65.5 in | Wt 366.5 lb

## 2024-04-17 DIAGNOSIS — R7401 Elevation of levels of liver transaminase levels: Secondary | ICD-10-CM | POA: Diagnosis not present

## 2024-04-17 DIAGNOSIS — K7581 Nonalcoholic steatohepatitis (NASH): Secondary | ICD-10-CM | POA: Insufficient documentation

## 2024-04-17 DIAGNOSIS — R161 Splenomegaly, not elsewhere classified: Secondary | ICD-10-CM

## 2024-04-17 DIAGNOSIS — R7303 Prediabetes: Secondary | ICD-10-CM | POA: Diagnosis not present

## 2024-04-17 DIAGNOSIS — K219 Gastro-esophageal reflux disease without esophagitis: Secondary | ICD-10-CM | POA: Diagnosis not present

## 2024-04-17 DIAGNOSIS — I10 Essential (primary) hypertension: Secondary | ICD-10-CM

## 2024-04-17 DIAGNOSIS — Z6841 Body Mass Index (BMI) 40.0 and over, adult: Secondary | ICD-10-CM | POA: Diagnosis not present

## 2024-04-17 DIAGNOSIS — R7989 Other specified abnormal findings of blood chemistry: Secondary | ICD-10-CM

## 2024-04-17 MED ORDER — TIRZEPATIDE-WEIGHT MANAGEMENT 2.5 MG/0.5ML ~~LOC~~ SOAJ: 2.5000 mg | SUBCUTANEOUS | 0 refills | Status: AC

## 2024-04-17 NOTE — Progress Notes (Signed)
 "   Gastroenterology Consultation  Referring Provider:     Randeen Laine LABOR, MD Primary Care Physician:  Tower, Laine LABOR, MD Primary Gastroenterologist:  Dr. Theophilus    Reason for Consultation:     Abnormal liver function test        HPI:   Tracey Cunningham is a 44 y.o. y/o female referred for consultation & management of abnormal liver function test by Dr. Randeen, Laine LABOR, MD. she is a pleasant 44 year old female with history of obesity and hypertension presents as a new consult.  Patient has no real GI related symptoms.  In late 2025 she was found to have abnormal liver function tests; particularly an AST of 82 and ALT of 160.  Alk phos 98.  Rest of her labs were normal.  Iron studies were sent and essentially rule out hemochromatosis.  The patient also had a ceruloplasmin sent and this was unremarkable.  The patient has been trying to lose weight and is actively seeing a dietitian for this.  She is considering a GLP-1.  She had been told in 2020 that her abnormal liver function test were most likely secondary to metabolic associated steatohepatitis at the time.  There is no symptoms to suggest portal hypertension.  Only relevant family history is a grand mother who had rectal cancer in her late age.  She has no bad habits.  Patient is a non-smoker nondrinker.  Past Medical History:  Diagnosis Date   Anemia    iron deficiency    Cerumen impaction    Family history of breast cancer    5/21 cancer genetic testing letter sent   Hypertension    borderline   Irregular periods    [facility]- [facility]    Obesity    weight loss with diet and exercise     Past Surgical History:  Procedure Laterality Date   APPENDECTOMY  10/2005   CHOLECYSTECTOMY     foot fracture     right foot     Prior to Admission medications  Medication Sig Start Date End Date Taking? Authorizing Provider  famotidine (PEPCID) 20 MG tablet Take 20 mg by mouth as needed.   Yes [provider]  gabapentin   (NEURONTIN ) 300 MG capsule Take 2 capsules by mouth 3 (three) times daily.   Yes [provider]  hydrochlorothiazide  (HYDRODIURIL ) 25 MG tablet TAKE 1 TABLET(25 MG) BY MOUTH DAILY 01/22/24  Yes Tower, Laine LABOR, MD  losartan  (COZAAR ) 50 MG tablet Take 1 tablet (50 mg total) by mouth daily. 02/15/24  Yes Tower, Laine LABOR, MD  potassium chloride  (KLOR-CON  10) 10 MEQ tablet Take 1 tablet (10 mEq total) by mouth daily. 11/19/23  Yes Tower, Laine LABOR, MD    Family History  Problem Relation Age of Onset   Hypertension Maternal Grandfather    Heart attack Maternal Grandfather    Breast cancer Mother 9       triple neg   Breast cancer Maternal Grandmother        70's   Breast cancer Paternal Grandmother        42's   Alcohol abuse Father      Social History[1]  Allergies as of 04/17/2024   (No Known Allergies)    Review of Systems:    All systems reviewed and negative except where noted in HPI.   Physical Exam:  BP 132/81   Pulse 99   Temp 98.1 F (36.7 C) (Oral)   Ht 5' 5.5 (1.664 m)  Wt (!) 365 lb 2 oz (165.6 kg)   LMP 04/12/2024   SpO2 99%   BMI 59.84 kg/m  Patient's last menstrual period was 04/12/2024. General:   Alert,  Well-developed, obese.  Well-nourished, pleasant and cooperative in NAD Head:  Normocephalic and atraumatic. Eyes:  Sclera clear, no icterus.   Conjunctiva pink. Ears:  Normal auditory acuity. Neck:  Supple; no masses or thyromegaly. Lungs:  Respirations even and unlabored.  Clear throughout to auscultation.   No wheezes, crackles, or rhonchi. No acute distress. Heart:  Regular rate and rhythm; no murmurs, clicks, rubs, or gallops. Abdomen:  Normal bowel sounds.  No bruits.  Soft, non-tender and non-distended without masses, hepatosplenomegaly or hernias noted.  No guarding or rebound tenderness..  Negative Carnett sign.   Rectal:  Deferred.. Pulses:  Normal pulses noted. Extremities:  No clubbing or edema.  No cyanosis. Neurologic:  Alert and  oriented x3;  grossly normal neurologically. Skin:  Intact without significant lesions or rashes..  No jaundice. Lymph Nodes:  No significant cervical adenopathy. Psych:  Alert and cooperative. Normal mood and affect.  Imaging Studies: No results found.  Assessment and Plan:   Tracey Cunningham is a 44 y.o. y/o female with a history of obesity and hypertension presenting with the following problems. 1.  Abnormal liver function test. 2.  Recent ultrasound showing hepatomegaly and splenomegaly. 3.  Having been told in the past that she had metabolic associated steatohepatitis  Plan. 1.  Full workup to rule out viral and metabolic hepatitides. 2.  Strong suspicion for metabolic associated steatohepatitis. 3.  Send CBC to appropriately calculate fib 4.  With a high fib 4, the patient will likely need elastography. 4.  Medical options should the patient have solidified MASH; GLP-1 and/or Rezdiffra.     Aloysius JAYSON Nap, MD. NOLIA    Note: This dictation was prepared with Dragon dictation along with smaller phrase technology. Any transcriptional errors that result from this process are unintentional.      [1]  Social History Tobacco Use   Smoking status: Never   Smokeless tobacco: Never  Vaping Use   Vaping status: Never Used  Substance Use Topics   Alcohol use: Yes    Alcohol/week: 0.0 standard drinks of alcohol    Comment: wine- occ   Drug use: No   "

## 2024-04-17 NOTE — Assessment & Plan Note (Signed)
 Seldom needs famotidine now  Overall eating better

## 2024-04-17 NOTE — Assessment & Plan Note (Signed)
 Due to steatohepatitis   Seeing GI /note reviewed today  Next labs planned for march

## 2024-04-17 NOTE — Patient Instructions (Addendum)
 Keep working on diet and exercise   Overall exercise goal 30 minutes or more 5 days per week   Strength training is just as or more important than cardio   Add some strength training to your routine, this is important for bone and brain health and can reduce your risk of falls and help your body use insulin properly and regulate weight  Light weights, exercise bands , and internet videos are a good way to start  Yoga (chair or regular), machines , floor exercises or a gym with machines are also good options      I sent prescription for tirzepatide  to your pharmacy to see if it is covered  If /when you get it -message or call us  before starting   Continue to meet with your nutritionist

## 2024-04-17 NOTE — Assessment & Plan Note (Addendum)
 bp in fair control at this time  BP Readings from Last 1 Encounters:  04/17/24 135/70   No changes needed Most recent labs reviewed  Disc lifstyle change with low sodium diet and exercise  Did not tolerate losartan  100 mg (light headed/orthostatic) , now back on 50 mg daily Hydrochlorothiazide  25 mg daily   Continues to work with nutritionist

## 2024-04-17 NOTE — Progress Notes (Signed)
 "  Subjective:    Patient ID: Tracey Cunningham, female    DOB: 1980/12/29, 44 y.o.   MRN: [id number]  HPI  Wt Readings from Last 3 Encounters:  04/17/24 (!) 366 lb 8 oz (166.2 kg)  04/17/24 (!) 365 lb 2 oz (165.6 kg)  01/03/24 (!) 374 lb 6 oz (169.8 kg)   60.06 kg/m  Vitals:   04/17/24 1548 04/17/24 1617  BP: (!) 146/58 135/70  Pulse: (!) 102   Temp: 97.7 F (36.5 C)   SpO2: 96%     Pt presents for follow up of  HTN hypokalemia Morbid obesity  Liver enzyme elevation   Feeling pretty good   Weight loss effort  Was seeing a nutritionist  Has been in a calorie deficit for months now but not losing any weight   Chair exercise  Recumbent elliptical bike-using that regularly  Some strength training     Is a bit of a stress eater  Working with dietician on that also   Protein  Meat Eggs Greek yogurt  Protein shakes  100-120 g per day  Lot of veggies/fruit  Some chia seeds for protein and fiber    No medullary thyroid cancer -self or family  No MEN in family or self  No history of pancreatitis  No etoh  Had ccy in the past   IBS- goes back and forth with diarrhea/constipation  Equal part of both  GERD -not bad /occational pepcid -not daily      HTN bp is stable today  No cp or palpitations or headaches or edema  No side effects to medicines  BP Readings from Last 3 Encounters:  04/17/24 135/70  04/17/24 132/81  01/03/24 133/80     Lab Results  Component Value Date   NA 138 01/03/2024   K 3.9 01/03/2024   CO2 30 01/03/2024   GLUCOSE 131 (H) 01/03/2024   BUN 10 01/03/2024   CREATININE 0.67 01/03/2024   CALCIUM 9.1 01/03/2024   GFR 107.17 01/03/2024   EGFR 112 11/17/2023   GFRNONAA 93 08/28/2006   Losartan  50 mg daily   (was light headed on 100 mg dose) Hydrochlorothiazide  25 mg daily    Saw GI today for elevated LFT  Dr Theophilus at [facility]  Has us  noting fatty change and also splenomegally  Dx with  Metabolic dysfunction  associated steatohepatitis  Labs were ordered to get in march   Cbc ordered to calc fib 4 May need elastography   Mentioned GLP-1 or rezdiffra     Lab Results  Component Value Date   ALT 160 (H) 01/03/2024   AST 82 (H) 01/03/2024   ALKPHOS 98 01/03/2024   BILITOT 0.5 01/03/2024   Lab Results  Component Value Date   HGBA1C 6.4 10/26/2023    Lab Results  Component Value Date   WBC 7.4 10/26/2023   HGB 11.8 (L) 10/26/2023   HCT 37.5 10/26/2023   MCV 75.9 (L) 10/26/2023   PLT 261.0 10/26/2023     Patient Active Problem List   Diagnosis Date Noted   Metabolic dysfunction-associated steatohepatitis (MASH) 04/17/2024   Fatty liver 01/24/2024   Splenomegaly 01/24/2024   Lymphedema 12/11/2023   Hypokalemia 11/09/2023   Encounter for hepatitis C screening test for low risk patient 10/26/2023   Encounter for screening for HIV 10/26/2023   Prediabetes 10/26/2023   Lesion of breast 10/26/2023   Acute left-sided low back pain with left-sided sciatica 02/18/2022   Pulmonary nodule 08/23/2020   Elevated  liver transaminase level 01/01/2019   Family history of breast cancer in mother 02/02/2016   GERD (gastroesophageal reflux disease) 11/05/2014   Encounter for routine gynecological examination 01/31/2012   Routine general medical examination at a health care facility 01/31/2012   IRRITABLE BOWEL SYNDROME 04/23/2008   Morbid obesity (HCC) 04/19/2007   ANEMIA-IRON DEFICIENCY 08/24/2006   Essential hypertension 08/24/2006   MENORRHALGIA 08/24/2006   Past Medical History:  Diagnosis Date   Anemia    iron deficiency    Cerumen impaction    Family history of breast cancer    5/21 cancer genetic testing letter sent   Hypertension    borderline   Irregular periods    [facility]- [place]    Obesity    weight loss with diet and exercise    Past Surgical History:  Procedure Laterality Date   APPENDECTOMY  10/2005   CHOLECYSTECTOMY     foot fracture     right foot     Social History[1] Family History  Problem Relation Age of Onset   Hypertension Maternal Grandfather    Heart attack Maternal Grandfather    Breast cancer Mother 43       triple neg   Breast cancer Maternal Grandmother        70's   Breast cancer Paternal Grandmother        70's   Alcohol abuse Father    Allergies[2] Medications Ordered Prior to Encounter[3]  Review of Systems  Constitutional:  Positive for fatigue. Negative for activity change, appetite change, fever and unexpected weight change.  HENT:  Negative for congestion, ear pain, rhinorrhea, sinus pressure and sore throat.   Eyes:  Negative for pain, redness and visual disturbance.  Respiratory:  Negative for cough, shortness of breath and wheezing.   Cardiovascular:  Negative for chest pain and palpitations.  Gastrointestinal:  Negative for abdominal distention, abdominal pain, blood in stool, constipation and diarrhea.       IBS Alternating diarrhea and constipation at times Tolerable   Endocrine: Negative for polydipsia and polyuria.  Genitourinary:  Negative for dysuria, frequency and urgency.  Musculoskeletal:  Positive for arthralgias. Negative for back pain and myalgias.  Skin:  Negative for pallor and rash.  Allergic/Immunologic: Negative for environmental allergies.  Neurological:  Negative for dizziness, syncope and headaches.  Hematological:  Negative for adenopathy. Does not bruise/bleed easily.  Psychiatric/Behavioral:  Negative for decreased concentration and dysphoric mood. The patient is not nervous/anxious.        Objective:   Physical Exam Constitutional:      General: She is not in acute distress.    Appearance: Normal appearance. She is well-developed. She is obese. She is not ill-appearing or diaphoretic.  HENT:     Head: Normocephalic and atraumatic.  Eyes:     Conjunctiva/sclera: Conjunctivae normal.     Pupils: Pupils are equal, round, and reactive to light.  Neck:     Thyroid: No  thyromegaly.     Vascular: No carotid bruit or JVD.  Cardiovascular:     Rate and Rhythm: Normal rate and regular rhythm.     Heart sounds: Normal heart sounds.     No gallop.  Pulmonary:     Effort: Pulmonary effort is normal. No respiratory distress.     Breath sounds: Normal breath sounds. No wheezing or rales.  Abdominal:     General: There is no distension or abdominal bruit.     Palpations: Abdomen is soft.  Musculoskeletal:  Cervical back: Normal range of motion and neck supple.     Comments: Baseline lymphedema   Lymphadenopathy:     Cervical: No cervical adenopathy.  Skin:    General: Skin is warm and dry.     Coloration: Skin is not pale.     Findings: No rash.  Neurological:     Mental Status: She is alert.     Motor: No weakness.     Coordination: Coordination normal.     Deep Tendon Reflexes: Reflexes are normal and symmetric. Reflexes normal.  Psychiatric:        Mood and Affect: Mood normal.           Assessment & Plan:   Problem List Items Addressed This Visit       Cardiovascular and Mediastinum   Essential hypertension - Primary   bp in fair control at this time  BP Readings from Last 1 Encounters:  04/17/24 135/70   No changes needed Most recent labs reviewed  Disc lifstyle change with low sodium diet and exercise  Did not tolerate losartan  100 mg (light headed/orthostatic) , now back on 50 mg daily Hydrochlorothiazide  25 mg daily   Continues to work with nutritionist         Digestive   Metabolic dysfunction-associated steatohepatitis (MASH)   Lab Results  Component Value Date   ALT 160 (H) 01/03/2024   AST 82 (H) 01/03/2024   ALKPHOS 98 01/03/2024   BILITOT 0.5 01/03/2024   Saw GI today  Labs planned for march   Discussed option of GLP-1 to help with weight loss  Going to see if covered by ins        GERD (gastroesophageal reflux disease)   Seldom needs famotidine now  Overall eating better         Other    Splenomegaly   Noted on US  along with fatty liver       Prediabetes   Prediabetes  Lab Results  Component Value Date   HGBA1C 6.4 10/26/2023    disc imp of low glycemic diet and wt loss to prevent DM2   Interested in GLP-1 for help with weight loss        Morbid obesity (HCC)   Bmi over 60  With co morbidities of HTN and prediabetes and steatohepatitis   Discussed how this problem influences overall health and the risks it imposes  Reviewed plan for weight loss with lower calorie diet (via better food choices (lower glycemic and portion control) along with exercise building up to or more than 30 minutes 5 days per week including some aerobic activity and strength training   Working with nutritionist and exercising  Despite calorie deficit, still not losing weight  Also noted strong fam history of obesity   Discussed opt of glp-1 weekly inj No personal of fam history of medullary thyroid ca or MEN  Disc option of GLP medication including possible side effects like GI intolerance and risk of thyroid and endocrine cancer, pancreatitis and gallstones, kidney problems and diabetic retinopathy  Sent tirzepetide 2.5 mg weekly to pharmacy to see if covered  If so-pt instructed to call when ready to start and will plan follow up   Stressed importance of strength building exercise        Relevant Medications   tirzepatide  (ZEPBOUND ) 2.5 MG/0.5ML Pen   Elevated liver transaminase level   Due to steatohepatitis   Seeing GI /note reviewed today  Next labs planned for march         [  1]  Social History Tobacco Use   Smoking status: Never   Smokeless tobacco: Never  Vaping Use   Vaping status: Never Used  Substance Use Topics   Alcohol use: Yes    Alcohol/week: 0.0 standard drinks of alcohol    Comment: wine- occ   Drug use: No  [2] No Known Allergies [3]  Current Outpatient Medications on File Prior to Visit  Medication Sig Dispense Refill   famotidine (PEPCID) 20  MG tablet Take 20 mg by mouth as needed.     gabapentin  (NEURONTIN ) 300 MG capsule Take 2 capsules by mouth 3 (three) times daily.     hydrochlorothiazide  (HYDRODIURIL ) 25 MG tablet TAKE 1 TABLET(25 MG) BY MOUTH DAILY 90 tablet 0   losartan  (COZAAR ) 50 MG tablet Take 1 tablet (50 mg total) by mouth daily. 90 tablet 1   potassium chloride  (KLOR-CON  10) 10 MEQ tablet Take 1 tablet (10 mEq total) by mouth daily. 90 tablet 2   No current facility-administered medications on file prior to visit.   "

## 2024-04-17 NOTE — Assessment & Plan Note (Signed)
 Bmi over 60  With co morbidities of HTN and prediabetes and steatohepatitis   Discussed how this problem influences overall health and the risks it imposes  Reviewed plan for weight loss with lower calorie diet (via better food choices (lower glycemic and portion control) along with exercise building up to or more than 30 minutes 5 days per week including some aerobic activity and strength training   Working with nutritionist and exercising  Despite calorie deficit, still not losing weight  Also noted strong fam history of obesity   Discussed opt of glp-1 weekly inj No personal of fam history of medullary thyroid ca or MEN  Disc option of GLP medication including possible side effects like GI intolerance and risk of thyroid and endocrine cancer, pancreatitis and gallstones, kidney problems and diabetic retinopathy  Sent tirzepetide 2.5 mg weekly to pharmacy to see if covered  If so-pt instructed to call when ready to start and will plan follow up   Stressed importance of strength building exercise

## 2024-04-17 NOTE — Addendum Note (Signed)
 Addended by: Voyd Groft, CHAN on: 04/17/2024 02:41 PM   Modules accepted: Orders

## 2024-04-17 NOTE — Assessment & Plan Note (Signed)
 Noted on US  along with fatty liver

## 2024-04-17 NOTE — Assessment & Plan Note (Signed)
 Prediabetes  Lab Results  Component Value Date   HGBA1C 6.4 10/26/2023    disc imp of low glycemic diet and wt loss to prevent DM2   Interested in GLP-1 for help with weight loss

## 2024-04-17 NOTE — Assessment & Plan Note (Signed)
 Lab Results  Component Value Date   ALT 160 (H) 01/03/2024   AST 82 (H) 01/03/2024   ALKPHOS 98 01/03/2024   BILITOT 0.5 01/03/2024   Saw GI today  Labs planned for march   Discussed option of GLP-1 to help with weight loss  Going to see if covered by ins

## 2024-04-18 ENCOUNTER — Telehealth: Payer: Self-pay | Admitting: *Deleted

## 2024-04-18 NOTE — Telephone Encounter (Signed)
 Tracey Cunningham

## 2024-05-28 ENCOUNTER — Ambulatory Visit

## 2024-06-11 ENCOUNTER — Encounter: Admitting: Family Medicine
